# Patient Record
Sex: Female | Born: 1980 | Race: Black or African American | Hispanic: No | Marital: Single | Smoking: Current every day smoker
Health system: Southern US, Community
[De-identification: ages and names within clinical notes are randomized; demographics above are authoritative.]

## PROBLEM LIST (undated history)

## (undated) DIAGNOSIS — R87619 Unspecified abnormal cytological findings in specimens from cervix uteri: Secondary | ICD-10-CM

## (undated) DIAGNOSIS — B9689 Other specified bacterial agents as the cause of diseases classified elsewhere: Secondary | ICD-10-CM

## (undated) DIAGNOSIS — I10 Essential (primary) hypertension: Secondary | ICD-10-CM

## (undated) DIAGNOSIS — IMO0002 Reserved for concepts with insufficient information to code with codable children: Secondary | ICD-10-CM

## (undated) DIAGNOSIS — B999 Unspecified infectious disease: Secondary | ICD-10-CM

## (undated) DIAGNOSIS — H409 Unspecified glaucoma: Secondary | ICD-10-CM

## (undated) DIAGNOSIS — N760 Acute vaginitis: Secondary | ICD-10-CM

## (undated) DIAGNOSIS — A549 Gonococcal infection, unspecified: Secondary | ICD-10-CM

## (undated) HISTORY — DX: Unspecified glaucoma: H40.9

---
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
## 2000-10-07 ENCOUNTER — Other Ambulatory Visit: Admission: RE | Admit: 2000-10-07 | Discharge: 2000-10-07 | Payer: Self-pay | Admitting: Obstetrics and Gynecology

## 2001-03-15 ENCOUNTER — Inpatient Hospital Stay (HOSPITAL_COMMUNITY): Admission: AD | Admit: 2001-03-15 | Discharge: 2001-03-15 | Payer: Self-pay | Admitting: *Deleted

## 2001-03-24 ENCOUNTER — Inpatient Hospital Stay (HOSPITAL_COMMUNITY): Admission: AD | Admit: 2001-03-24 | Discharge: 2001-03-27 | Payer: Self-pay | Admitting: Obstetrics and Gynecology

## 2001-12-31 ENCOUNTER — Emergency Department (HOSPITAL_COMMUNITY): Admission: EM | Admit: 2001-12-31 | Discharge: 2001-12-31 | Payer: Self-pay | Admitting: Emergency Medicine

## 2001-12-31 ENCOUNTER — Encounter: Payer: Self-pay | Admitting: Emergency Medicine

## 2002-05-28 ENCOUNTER — Other Ambulatory Visit: Admission: RE | Admit: 2002-05-28 | Discharge: 2002-05-28 | Payer: Self-pay | Admitting: Obstetrics and Gynecology

## 2002-06-18 HISTORY — PX: TUBAL LIGATION: SHX77

## 2002-08-29 ENCOUNTER — Inpatient Hospital Stay (HOSPITAL_COMMUNITY): Admission: AD | Admit: 2002-08-29 | Discharge: 2002-09-01 | Payer: Self-pay | Admitting: Obstetrics and Gynecology

## 2002-10-22 ENCOUNTER — Ambulatory Visit (HOSPITAL_COMMUNITY): Admission: RE | Admit: 2002-10-22 | Discharge: 2002-10-22 | Payer: Self-pay | Admitting: Obstetrics and Gynecology

## 2003-09-01 ENCOUNTER — Emergency Department (HOSPITAL_COMMUNITY): Admission: EM | Admit: 2003-09-01 | Discharge: 2003-09-01 | Payer: Self-pay | Admitting: Emergency Medicine

## 2004-03-14 ENCOUNTER — Inpatient Hospital Stay (HOSPITAL_COMMUNITY): Admission: AD | Admit: 2004-03-14 | Discharge: 2004-03-14 | Payer: Self-pay | Admitting: *Deleted

## 2005-06-18 DIAGNOSIS — A549 Gonococcal infection, unspecified: Secondary | ICD-10-CM

## 2005-06-18 HISTORY — DX: Gonococcal infection, unspecified: A54.9

## 2006-04-30 ENCOUNTER — Other Ambulatory Visit: Admission: RE | Admit: 2006-04-30 | Discharge: 2006-04-30 | Payer: Self-pay | Admitting: Obstetrics and Gynecology

## 2006-08-03 ENCOUNTER — Emergency Department (HOSPITAL_COMMUNITY): Admission: EM | Admit: 2006-08-03 | Discharge: 2006-08-03 | Payer: Self-pay | Admitting: Family Medicine

## 2007-01-17 ENCOUNTER — Emergency Department (HOSPITAL_COMMUNITY): Admission: EM | Admit: 2007-01-17 | Discharge: 2007-01-17 | Payer: Self-pay | Admitting: Emergency Medicine

## 2007-06-19 HISTORY — PX: WISDOM TOOTH EXTRACTION: SHX21

## 2007-07-02 ENCOUNTER — Encounter: Admission: RE | Admit: 2007-07-02 | Discharge: 2007-07-02 | Payer: Self-pay | Admitting: Obstetrics and Gynecology

## 2007-12-25 ENCOUNTER — Emergency Department (HOSPITAL_COMMUNITY): Admission: EM | Admit: 2007-12-25 | Discharge: 2007-12-25 | Payer: Self-pay | Admitting: Emergency Medicine

## 2008-02-26 ENCOUNTER — Emergency Department (HOSPITAL_COMMUNITY): Admission: EM | Admit: 2008-02-26 | Discharge: 2008-02-26 | Payer: Self-pay | Admitting: Emergency Medicine

## 2010-07-09 ENCOUNTER — Encounter: Payer: Self-pay | Admitting: Obstetrics and Gynecology

## 2010-11-03 NOTE — Op Note (Signed)
NAMESIMON, Mikayla Hansen                    ACCOUNT NO.:  [id number]   MEDICAL RECORD NO.:  [id number]                   PATIENT TYPE:  AMB   LOCATION:  SDC                                  [facility]:  [facility]   PHYSICIAN:  Hal Morales, M.D.             DATE OF BIRTH:  12/25/1980   DATE OF PROCEDURE:  10/22/2002  DATE OF DISCHARGE:                                 OPERATIVE REPORT   PREOPERATIVE DIAGNOSIS:  Desire for surgical sterilization.   POSTOPERATIVE DIAGNOSIS:  Desire for surgical sterilization.   PROCEDURE:  Laparoscopic tubal cautery.   SURGEON:  Hal Morales, M.D.   ANESTHESIA:  General orotracheal.   ESTIMATED BLOOD LOSS:  Less than 10 mL.   COMPLICATIONS:  None.   FINDINGS:  The uterus, tubes, and ovaries were within normal limits.   DESCRIPTION OF PROCEDURE:  The patient was taken to the operating room after  appropriate identification and then placed on the operating table.  After  the attainment of adequate general anesthesia she was placed in the modified  lithotomy position.  The abdomen, perineum, and vagina were prepped with  multiple layers of Betadine.  A red Robinson catheter was used to empty the  bladder.  A Hulka tenaculum was placed on the cervix.  The abdomen was  draped as a sterile field.  Subumbilical and suprapubic injection of 0.25%  Marcaine for a total  of 10 mL was undertaken.  A subumbilical incision was  made and the Veress cannula placed through that incision into the peritoneal  cavity.  A pneumoperitoneum was created with 3.5 L of CO2.  The Veress  cannula was removed and a laparoscopic trocar placed through that incision  into the peritoneal cavity.  The laparoscope was placed through the trocar  sleeve.  The above-noted findings were made.  The right fallopian tube was  identified, followed to its fimbriated end, then grasped at the isthmic  portion and cauterized in two adjacent areas.  A similar procedure was  carried  out on the opposite side.  Hemostasis was noted to be adequate.  All  instruments were removed from the peritoneal cavity under direct  visualization as the CO2 was allowed to escape.  The subumbilical incision  was closed first with a fascial suture of 0 Vicryl, then with a subcuticular  suture of 3-0 Vicryl.  A sterile dressing was applied and the Hulka  tenaculum removed.  The patient was awakened from general anesthesia and  taken to the recovery room in satisfactory condition, having tolerated the  procedure well with sponge and instrument counts correct.                                               Hal Morales, M.D.    VPH/MEDQ  D:  10/22/2002  T:  10/23/2002  Job:  [id number]
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
## 2010-11-03 NOTE — H&P (Signed)
NAME:  Mikayla Hansen, Mikayla Hansen                    ACCOUNT NO.:  [id number]   MEDICAL RECORD NO.:  [id number]                   PATIENT TYPE:  AMB   LOCATION:  SDC                                  [facility]   PHYSICIAN:  Hal Morales, M.D.             DATE OF BIRTH:  [birth year]   DATE OF ADMISSION:  DATE OF DISCHARGE:                                HISTORY & PHYSICAL   HISTORY OF PRESENT ILLNESS:  The patient is a 30 year old, single, African  American female, para 4-0-0-4, who presents for laparoscopic tubal cautery  due to her desire for permanent sterilization.  The patient's last menstrual  period was on October 04, 2002, and was within normal limits.  She had a  negative pregnancy test on Oct 21, 2002.   OBSTETRICAL HISTORY:  Gravida 4, para 4-0-0-4.  She has a history of preterm  labor during two of her pregnancies.   GYNECOLOGIC HISTORY:  Menarche at 30 years old.  The patient's menstrual  periods are regular.  She does have a history of HPV, Trichomonas, and  chlamydia.  She also has a history of abnormal Pap smears.  Her last normal  Pap smear was in December of 2003.   PAST MEDICAL HISTORY:  Positive for asthma.  The patient denies any history  of blood transfusions.   PAST SURGICAL HISTORY:  Negative.   FAMILY HISTORY:  Positive for diabetes mellitus and hypertension.   SOCIAL HISTORY:  The patient is single and she attends [employer] in an effort to  acquire her gradual equivalency diploma.   CURRENT HABITS:  The patient smokes one packs of cigarettes per day.  She  drinks alcohol on occasion.   MEDICATIONS:  Prenatal vitamins.   ALLERGIES:  She has no known drug allergies.   REVIEW OF SYSTEMS:  Positive for wearing glasses, otherwise negative.   PHYSICAL EXAMINATION:  VITAL SIGNS:  Blood pressure 100/60.  WEIGHT:  141 pounds.  HEIGHT:  5 feet 4-1/2 inches tall.  NECK:  Supple without masses.  There is no adenopathy or thyromegaly.  HEART:  Regular rate and  rhythm.  There is no murmur.  LUNGS:  Clear without wheezing, rhonchi, or rales.  BACK:  No CVA tenderness.  ABDOMEN:  Bowel sounds are present.  It is soft without tenderness,  guarding, rebound, or organomegaly.  EXTREMITIES:  Without cyanosis, clubbing, or edema.  PELVIC:  EG and BUS are within normal limits.  The vagina is normal.  The  cervix is normal without lesions.  The uterus is normal size, shape, and  consistency without tenderness.  Adnexa without tenderness or masses.  Rectovaginal without tenderness or masses.   LABORATORY DATA:  The urine pregnancy test is negative.   IMPRESSION:  Desire for permanent sterilization.   DISPOSITION:  The patient signed the consent for sterilization on August 07, 2002, which states that he understands that sterilization must be  considered permanent and not  reversible and that she has decided that she  does not want to become pregnant.  She further verbalizes understanding of  the procedure along with its risks, which include reaction to anesthesia,  damage to adjacent organs, bleeding, and infection.  The patient is  scheduled to undergo laparoscopy tubal cautery at [facility] on Oct 22, 2002, at 12:30 p.m.     [place]mira J. Adline Peals.                    Hal Morales, M.D.    EJP/MEDQ  D:  10/21/2002  T:  10/21/2002  Job:  [id number]

## 2011-03-26 ENCOUNTER — Encounter (INDEPENDENT_AMBULATORY_CARE_PROVIDER_SITE_OTHER): Payer: Self-pay | Admitting: Ophthalmology

## 2011-04-09 ENCOUNTER — Encounter (INDEPENDENT_AMBULATORY_CARE_PROVIDER_SITE_OTHER): Payer: Self-pay | Admitting: Ophthalmology

## 2011-04-16 ENCOUNTER — Encounter (INDEPENDENT_AMBULATORY_CARE_PROVIDER_SITE_OTHER): Payer: Medicaid Other | Admitting: Ophthalmology

## 2011-04-16 DIAGNOSIS — H40019 Open angle with borderline findings, low risk, unspecified eye: Secondary | ICD-10-CM

## 2011-04-16 DIAGNOSIS — H3540 Unspecified peripheral retinal degeneration: Secondary | ICD-10-CM

## 2011-04-16 DIAGNOSIS — H43819 Vitreous degeneration, unspecified eye: Secondary | ICD-10-CM

## 2011-08-30 ENCOUNTER — Ambulatory Visit: Payer: Medicaid Other | Admitting: Family Medicine

## 2011-09-07 ENCOUNTER — Other Ambulatory Visit (HOSPITAL_COMMUNITY)
Admission: RE | Admit: 2011-09-07 | Discharge: 2011-09-07 | Disposition: A | Discharge status: Home / Self Care | Payer: Medicaid Other | Source: Ambulatory Visit | Attending: Family Medicine | Admitting: Family Medicine

## 2011-09-07 ENCOUNTER — Encounter: Payer: Self-pay | Admitting: Family Medicine

## 2011-09-07 ENCOUNTER — Ambulatory Visit (INDEPENDENT_AMBULATORY_CARE_PROVIDER_SITE_OTHER): Payer: Medicaid Other | Admitting: Family Medicine

## 2011-09-07 ENCOUNTER — Other Ambulatory Visit (HOSPITAL_COMMUNITY)
Admission: RE | Admit: 2011-09-07 | Discharge: 2011-09-07 | Disposition: A | Discharge status: Home / Self Care | Payer: Self-pay | Source: Ambulatory Visit | Attending: Emergency Medicine | Admitting: Emergency Medicine

## 2011-09-07 VITALS — BP 130/92 | HR 85 | Temp 98.4°F | Ht 64.0 in | Wt 184.2 lb

## 2011-09-07 DIAGNOSIS — B9689 Other specified bacterial agents as the cause of diseases classified elsewhere: Secondary | ICD-10-CM

## 2011-09-07 DIAGNOSIS — N760 Acute vaginitis: Secondary | ICD-10-CM | POA: Insufficient documentation

## 2011-09-07 DIAGNOSIS — F17200 Nicotine dependence, unspecified, uncomplicated: Secondary | ICD-10-CM

## 2011-09-07 DIAGNOSIS — Z124 Encounter for screening for malignant neoplasm of cervix: Secondary | ICD-10-CM

## 2011-09-07 DIAGNOSIS — Z113 Encounter for screening for infections with a predominantly sexual mode of transmission: Secondary | ICD-10-CM | POA: Insufficient documentation

## 2011-09-07 DIAGNOSIS — Z8709 Personal history of other diseases of the respiratory system: Secondary | ICD-10-CM | POA: Insufficient documentation

## 2011-09-07 DIAGNOSIS — J45909 Unspecified asthma, uncomplicated: Secondary | ICD-10-CM

## 2011-09-07 DIAGNOSIS — Z01419 Encounter for gynecological examination (general) (routine) without abnormal findings: Secondary | ICD-10-CM | POA: Insufficient documentation

## 2011-09-07 DIAGNOSIS — E669 Obesity, unspecified: Secondary | ICD-10-CM | POA: Insufficient documentation

## 2011-09-07 DIAGNOSIS — Z23 Encounter for immunization: Secondary | ICD-10-CM

## 2011-09-07 DIAGNOSIS — A499 Bacterial infection, unspecified: Secondary | ICD-10-CM

## 2011-09-07 HISTORY — DX: Other specified bacterial agents as the cause of diseases classified elsewhere: B96.89

## 2011-09-07 LAB — POCT WET PREP (WET MOUNT)

## 2011-09-07 MED ORDER — METRONIDAZOLE 500 MG PO TABS: 500.0000 mg | ORAL_TABLET | Freq: Two times a day (BID) | ORAL | Status: AC

## 2011-09-07 NOTE — Patient Instructions (Signed)
It has been a pleasure to meet  you today. Please take the medications as prescribed. I will call you with the labs results if they come back abnormal otherwise you will receive a letter. Make your next appointment in 6 months or sooner if you need it.

## 2011-09-08 LAB — CBC
MCH: 23.5 pg — ABNORMAL LOW (ref 26.0–34.0)
Platelets: 294 10*3/uL (ref 150–400)
RBC: 5.84 MIL/uL — ABNORMAL HIGH (ref 3.87–5.11)
RDW: 15 % (ref 11.5–15.5)
WBC: 8.9 10*3/uL (ref 4.0–10.5)

## 2011-09-08 LAB — COMPREHENSIVE METABOLIC PANEL
Albumin: 4.4 g/dL (ref 3.5–5.2)
Alkaline Phosphatase: 65 U/L (ref 39–117)
CO2: 20 mEq/L (ref 19–32)
Glucose, Bld: 95 mg/dL (ref 70–99)
Potassium: 3.9 mEq/L (ref 3.5–5.3)
Sodium: 140 mEq/L (ref 135–145)
Total Protein: 7.1 g/dL (ref 6.0–8.3)

## 2011-09-09 NOTE — Progress Notes (Signed)
  Subjective:    Patient ID: Mikayla Hansen, female    DOB: 1980-12-19, 31 y.o.   MRN: [id number]  HPI Patient comes today for her first visit. She has history of asthma and was taking only albuterol when necessary, and for the past 3 years has not had any episodes of shortness of breath. She also has a history of being a heavy smoker, smoking up to 2-3 packs a day and have reduced to 1 pack a day for about one year. She also has history of an abnormal Pap smear 3 years ago,  that was taken at [facility] with no follow up as this patient never went back to that clinic. She complains at this time of vaginal discharge and intentional weight loss about 10 lb with diet management. No changes in appetite. No burning with urination, no changes in her bowel movement habits. Denies any other symptoms of headache, chest pain, palpitations.   Review of Systems  Constitutional: Negative for chills, activity change, appetite change, fatigue and unexpected weight change.  HENT: Negative.   Eyes: Negative.   Respiratory: Negative for cough and chest tightness.        Objective:   Physical Exam  Constitutional: She is oriented to person, place, and time. No distress.  HENT:  Head: Normocephalic and atraumatic.  Right Ear: External ear normal.  Left Ear: External ear normal.  Mouth/Throat: Oropharynx is clear and moist.  Eyes: Conjunctivae and EOM are normal. Pupils are equal, round, and reactive to light.  Neck: Normal range of motion. Neck supple. No thyromegaly present.  Cardiovascular: Normal rate, regular rhythm and intact distal pulses.  Exam reveals no gallop and no friction rub.   No murmur heard. Pulmonary/Chest: Effort normal and breath sounds normal. No respiratory distress. She has no wheezes. She has no rales.  Abdominal: Soft. Bowel sounds are normal. She exhibits no distension and no mass. There is no tenderness.  Genitourinary: Uterus normal. Vaginal discharge found.         GU: No CVA tenderness.  Speculum: Thin grayish fetid vaginal discharge. Cervix friable to pap smear procedure.  Musculoskeletal: Normal range of motion. She exhibits no edema and no tenderness.  Lymphadenopathy:    She has no cervical adenopathy.  Neurological: She is alert and oriented to person, place, and time. She has normal reflexes.  Skin: Skin is warm and dry.          Assessment & Plan:

## 2011-09-11 ENCOUNTER — Encounter: Payer: Self-pay | Admitting: Family Medicine

## 2011-09-11 DIAGNOSIS — F17200 Nicotine dependence, unspecified, uncomplicated: Secondary | ICD-10-CM | POA: Insufficient documentation

## 2011-09-11 NOTE — Assessment & Plan Note (Signed)
Not ready to completely quit at this point.  Plan:  1800-quitnow offered. Counseling about smoking cessation given.

## 2011-09-11 NOTE — Assessment & Plan Note (Signed)
Pt used to take albuterol PRN. Has not had the need to take it in 3 years. She has dogs at home that she breads and has no change environment recently. No SOB or episodic coughing/wheezes.  Plan: Education given. If pt experienced any SOB should get evaluated. Will f/u at next visit.

## 2011-09-11 NOTE — Assessment & Plan Note (Addendum)
Pt mention Hx of abnormal pap smear and vaginal discharge. Positive wet prep Plan:  -Metronidazole 500 mg BID for 7 days. Side effects and alcohol avoidance explained and pt agreeable. -pap smear and will f/u results.

## 2011-09-11 NOTE — Assessment & Plan Note (Addendum)
Pt mention she has lost about 10 Lb intentionally (with diet modification) we dont have a previous weight to compare.  Plan: Positive reinforcement about weight control. Brief nutritional education given.Continue evaluating weight curve. -CBC, Cmet, Lipid Profile.

## 2011-10-09 ENCOUNTER — Telehealth: Payer: Self-pay | Admitting: Family Medicine

## 2011-10-09 ENCOUNTER — Encounter: Payer: Self-pay | Admitting: Family Medicine

## 2011-10-09 ENCOUNTER — Ambulatory Visit (INDEPENDENT_AMBULATORY_CARE_PROVIDER_SITE_OTHER): Payer: Medicaid Other | Admitting: Family Medicine

## 2011-10-09 VITALS — BP 130/84 | HR 88 | Temp 98.1°F | Wt 184.7 lb

## 2011-10-09 DIAGNOSIS — R300 Dysuria: Secondary | ICD-10-CM

## 2011-10-09 DIAGNOSIS — B373 Candidiasis of vulva and vagina: Secondary | ICD-10-CM

## 2011-10-09 DIAGNOSIS — N760 Acute vaginitis: Secondary | ICD-10-CM

## 2011-10-09 LAB — POCT UA - MICROSCOPIC ONLY: Epithelial cells, urine per micros: >20

## 2011-10-09 LAB — POCT URINALYSIS DIPSTICK
Bilirubin, UA: NEGATIVE
Glucose, UA: NEGATIVE
Ketones, UA: NEGATIVE
pH, UA: 6

## 2011-10-09 LAB — POCT WET PREP (WET MOUNT)

## 2011-10-09 MED ORDER — FLUCONAZOLE 150 MG PO TABS: 150.0000 mg | ORAL_TABLET | Freq: Once | ORAL | Status: AC

## 2011-10-09 NOTE — Assessment & Plan Note (Addendum)
Treat with fluconazole 150mg  x1. Follow up in 5 days if not improved.  Due to dysuria, will obtain UA but suspect likely just from irritation from wiping. UA also shows hyphae and spores consistent with yeast infection. Do not suspect candidal UTI as there are many squamous cells and likely a poor sample.

## 2011-10-09 NOTE — Telephone Encounter (Signed)
Patient is calling because she is having a reaction to Flagyl.  She says it is urgent that she speaks to Dr. Aviva Signs, I offered for her to speak to the nurse, but she said she preferred to speak to the doctor.

## 2011-10-09 NOTE — Telephone Encounter (Signed)
Spoke with patient. She did not start Flagyl prescribed at last appointment until around 04/12. Finished the medication end of last week. Now she is having severe  vaginal itching and burning .  Does not think she can wait until tomorrow for appointment. Advised to come to office now.

## 2011-10-09 NOTE — Progress Notes (Signed)
  Subjective:    Patient ID: Mikayla Hansen, female    DOB: [birth year], 31 y.o.   MRN: [id number]  HPI patient was treated for bacterial vaginosis after visit on March 22. She did not start the therapy until April 12. 2-3 days ago after finishing her therapy she developed vaginal itching and burning. She describes itching and burning as severe. This is associated with vaginal discharge which has a foul odor. She says the discharge has a cottage cheese consistency. She is also noted pain with urination starting today and she says she's had a wipe a great deal due to the discharge.   Denies fevers, nausea, vomiting, chills, feeling poorly except for itching, new sexual contacts.  Review of Systems -See HPI  Past Medical History-smoking status noted: current smoker. Obesity, recent bacterial vaginosis.  Reviewed problem list.  Medications- reviewed and updated Chief complaint-noted    Objective:   Physical Exam  Constitutional: She is oriented to person, place, and time. She appears well-developed and well-nourished. No distress.  HENT:  Head: Normocephalic and atraumatic.  Right Ear: External ear normal.  Left Ear: External ear normal.  Eyes: EOM are normal. Right eye exhibits no discharge. Left eye exhibits no discharge.  Neck: Normal range of motion. Neck supple.  Genitourinary: Vaginal discharge (blind swab performed) found.  Musculoskeletal: Normal range of motion. She exhibits no edema.  Neurological: She is alert and oriented to person, place, and time.  Skin: She is not diaphoretic.      Assessment & Plan:

## 2011-10-09 NOTE — Patient Instructions (Signed)
Mikayla Hansen I believe you have a yeast infection after starting antibiotics. I have sent in a prescription that you only need to take 1 pill. We are also going to test your urine since you have had some burning but I expect this to be from all the wiping you've been doing. If things aren't better in the next 5 days, you should be reevaluated. We have sent a lab to confirm that it is a yeast infection but since we are treating you if you don't hear from me, then assume it is a yeast infection.   Tana Conch, MD, PGY1 10/09/2011 5:06 PM

## 2011-10-16 ENCOUNTER — Inpatient Hospital Stay (HOSPITAL_COMMUNITY)
Admission: AD | Admit: 2011-10-16 | Discharge: 2011-10-16 | Disposition: A | Discharge status: Home / Self Care | Payer: Medicaid Other | Source: Ambulatory Visit | Attending: Obstetrics & Gynecology | Admitting: Obstetrics & Gynecology

## 2011-10-16 ENCOUNTER — Encounter (HOSPITAL_COMMUNITY): Payer: Self-pay | Admitting: *Deleted

## 2011-10-16 DIAGNOSIS — L293 Anogenital pruritus, unspecified: Secondary | ICD-10-CM | POA: Insufficient documentation

## 2011-10-16 DIAGNOSIS — N907 Vulvar cyst: Secondary | ICD-10-CM

## 2011-10-16 DIAGNOSIS — N9089 Other specified noninflammatory disorders of vulva and perineum: Secondary | ICD-10-CM | POA: Insufficient documentation

## 2011-10-16 HISTORY — DX: Gonococcal infection, unspecified: A54.9

## 2011-10-16 HISTORY — DX: Acute vaginitis: N76.0

## 2011-10-16 HISTORY — DX: Other specified bacterial agents as the cause of diseases classified elsewhere: B96.89

## 2011-10-16 HISTORY — DX: Unspecified abnormal cytological findings in specimens from cervix uteri: R87.619

## 2011-10-16 HISTORY — DX: Reserved for concepts with insufficient information to code with codable children: IMO0002

## 2011-10-16 HISTORY — DX: Unspecified infectious disease: B99.9

## 2011-10-16 MED ORDER — OXYCODONE-ACETAMINOPHEN 5-325 MG PO TABS: 1.0000 | ORAL_TABLET | ORAL | Status: AC | PRN

## 2011-10-16 MED ORDER — KETOROLAC TROMETHAMINE 60 MG/2ML IM SOLN
60.0000 mg | Freq: Once | INTRAMUSCULAR | Status: AC
  Administered 2011-10-16: 60 mg via INTRAMUSCULAR
  Filled 2011-10-16: qty 2

## 2011-10-16 MED ORDER — KETOROLAC TROMETHAMINE 10 MG PO TABS: 10.0000 mg | ORAL_TABLET | Freq: Four times a day (QID) | ORAL | Status: AC | PRN

## 2011-10-16 NOTE — MAU Note (Signed)
Pt was Dx with BV at [facility] and has completed Flagyl and was seen again this past Tues. For Yeast infection after antibiotic use.  Pt was given diflucan for treatment and continues to have itching in vaginal area and has now noticed a bump on the right labia majoria since Thurs. Of last week.  It has grown in size since Thurs from a size of pimple to now a lima bean.  Pt is currently on her period at this time.

## 2011-10-16 NOTE — MAU Provider Note (Signed)
Mikayla Hansen [id number]. Chief Complaint  Patient presents with  . Vaginal Itching  . Mass    on right labia     First Provider Initiated Contact with Patient 10/16/11 0947      SUBJECTIVE  HPI: Painful lump on right labia minora x 4 days, enlarging. No fever, chills, vaginal discharge. No Hx Bartholin's cysts or Herpes.  Past Medical History  Diagnosis Date  . Asthma   . Abnormal Pap smear   . Infection   . Gonorrhea 2007  . Bacterial vaginosis 09-07-2011   Past Surgical History  Procedure Date  . Tubal ligation 2004  . Wisdom tooth extraction 2009   History   Social History  . Marital Status: Single    Spouse Name: N/A    Number of Children: N/A  . Years of Education: N/A   Occupational History  . Not on file.   Social History Main Topics  . Smoking status: Current Everyday Smoker -- 1.5 packs/day  . Smokeless tobacco: Never Used  . Alcohol Use: No  . Drug Use: No  . Sexually Active: Yes    Birth Control/ Protection: Surgical   Other Topics Concern  . Not on file   Social History Narrative   Lives with 4 children and husband (married for 17 years). 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   No current facility-administered medications on file prior to encounter.   No current outpatient prescriptions on file prior to encounter.   Allergies  Allergen Reactions  . Hydrocodone Palpitations    ROS: Pertinent items in HPI  OBJECTIVE Blood pressure 134/82, pulse 89, temperature 98.6 F (37 C), temperature source Oral, resp. rate 16, height 5\' 4"  (1.626 m), weight 84.097 kg (185 lb 6.4 oz), last menstrual period 09/19/2011, SpO2 100.00%.  GENERAL: Well-developed, well-nourished female in mild distress.  HEENT: Normocephalic, good dentition HEART: normal rate RESP: normal effort ABDOMEN: Soft, nontender EXTREMITIES: Nontender, no edema NEURO: Alert and oriented PELVIC: 1x2 cm fluctuant, tender mass on mid-labia minora. No erythema or  drainage.   Discussed options of expectant management w/ warm soaks/compresses vs I&D. Pt strongly desires I&D Incision and Drainage Procedure Note  Pre-operative Diagnosis: Labial inclusion cyst  Post-operative Diagnosis: same  Indications: Pain  Analgesia/Anesthesia: Toradol, 1% plain lidocaine  Procedure Details  The procedure, risks and complications have been discussed in detail (including, but not limited to airway compromise, infection, bleeding) with the patient, and the patient has signed consent to the procedure. A timeout was performed.   The skin was cleaned x 3 w/ betadine and sterilely prepped and draped over the affected area in the usual fashion. After adequate local anesthesia, I&D with a #11 blade was performed on the right labia minora. Drained moderate amount of non-purulent drainage.. The patient tolerate the procedure well. The patient was observed until stable.  Findings: See above  EBL: 1 cc's  Drains: none  Condition: Tolerated procedure well and Stable   Complications: none.  ASSESSMENT 1. Sebaceous cyst of labia    PLAN D/C home Follow-up Information    Follow up with [facility]. (As needed if symptoms worsen)    Contact information:   [address] [place] [address] [phone]        Medication List  As of 10/22/2011  4:35 PM   START taking these medications         oxyCODONE-acetaminophen 5-325 MG per tablet   Commonly known as: PERCOCET   Take 1-2 tablets  by mouth every 4 (four) hours as needed for pain.         STOP taking these medications         metroNIDAZOLE 500 MG tablet      MIDOL COMPLETE PO      naproxen sodium 220 MG tablet          Where to get your medications    These are the prescriptions that you need to pick up.   You may get these medications from any pharmacy.         oxyCODONE-acetaminophen 5-325 MG per tablet          Bleeding and infections precautions No IC x 1  week  Dorathy Kinsman 10/16/2011 9:48 AM

## 2012-02-17 ENCOUNTER — Inpatient Hospital Stay (HOSPITAL_COMMUNITY)
Admission: AD | Admit: 2012-02-17 | Discharge: 2012-02-17 | Disposition: A | Discharge status: Hospice | Payer: Medicaid Other | Source: Ambulatory Visit | Attending: Family Medicine | Admitting: Family Medicine

## 2012-02-17 ENCOUNTER — Encounter (HOSPITAL_COMMUNITY): Payer: Self-pay | Admitting: *Deleted

## 2012-02-17 DIAGNOSIS — H60399 Other infective otitis externa, unspecified ear: Secondary | ICD-10-CM | POA: Insufficient documentation

## 2012-02-17 DIAGNOSIS — H6090 Unspecified otitis externa, unspecified ear: Secondary | ICD-10-CM

## 2012-02-17 DIAGNOSIS — I10 Essential (primary) hypertension: Secondary | ICD-10-CM

## 2012-02-17 DIAGNOSIS — L738 Other specified follicular disorders: Secondary | ICD-10-CM | POA: Insufficient documentation

## 2012-02-17 DIAGNOSIS — R030 Elevated blood-pressure reading, without diagnosis of hypertension: Secondary | ICD-10-CM | POA: Insufficient documentation

## 2012-02-17 DIAGNOSIS — L739 Follicular disorder, unspecified: Secondary | ICD-10-CM

## 2012-02-17 DIAGNOSIS — N949 Unspecified condition associated with female genital organs and menstrual cycle: Secondary | ICD-10-CM | POA: Insufficient documentation

## 2012-02-17 MED ORDER — SULFAMETHOXAZOLE-TRIMETHOPRIM 800-160 MG PO TABS: 1.0000 | ORAL_TABLET | Freq: Two times a day (BID) | ORAL | Status: AC

## 2012-02-17 MED ORDER — CIPROFLOXACIN-DEXAMETHASONE 0.3-0.1 % OT SUSP: 4.0000 [drp] | Freq: Two times a day (BID) | OTIC | Status: AC

## 2012-02-17 NOTE — MAU Note (Addendum)
Pt corrected her complaint saying her vaginal bump is in the right groin not the labia and is the size of an eraser head.

## 2012-02-17 NOTE — MAU Note (Signed)
Pt reports having vaginal "bumps" on left side of her labia.pt stated she  "busted it 2 days ago. Still having pain in area. Was treated 1 month ago for barthalins cyst on the right. Pt also c/o  Ear ache on right ear that started this morning.

## 2012-02-17 NOTE — MAU Provider Note (Signed)
History     CSN: [id number]  Arrival date and time: 02/17/12 1434   First Provider Initiated Contact with Patient 02/17/12 1544      Chief Complaint  Patient presents with  . Groin Swelling   HPI Pt has lump in R groin for past few days. Had one in same place last week that she popped, which is now healing. Also has hx bartholin's cyst. Lump in tender/painful esp when walking, as legs rub together. No fever/chills.  Also has R ear pain for 2 days, drained orangish fluid today. No sore throat or other URI symptoms. Has hx swimmer's ear but not swimming recently.  Pt also has headache. Hx migraines. R shoulder pain radiating down arm.   Past Medical History  Diagnosis Date  . Asthma   . Abnormal Pap smear   . Infection   . Gonorrhea 2007  . Bacterial vaginosis 09-07-2011    Past Surgical History  Procedure Date  . Tubal ligation 2004  . Wisdom tooth extraction 2009    Family History  Problem Relation Age of Onset  . Hypertension Mother   . Seizures Mother   . Anesthesia problems Neg Hx     History  Substance Use Topics  . Smoking status: Current Everyday Smoker -- 1.5 packs/day    Types: Cigarettes  . Smokeless tobacco: Never Used  . Alcohol Use: No    Allergies:  Allergies  Allergen Reactions  . Hydrocodone Palpitations    No prescriptions prior to admission    Review of Systems  Constitutional: Negative for fever and chills.  HENT: Positive for ear pain and ear discharge. Negative for hearing loss, congestion and sore throat.   Eyes: Negative for blurred vision and double vision.  Respiratory: Negative for cough, shortness of breath and wheezing.   Cardiovascular: Negative for chest pain and palpitations.  Gastrointestinal: Negative for nausea, vomiting and abdominal pain.  Genitourinary: Negative for dysuria.  Skin: Negative for rash.  Neurological: Positive for headaches. Negative for dizziness.   Physical Exam   Blood pressure 153/98, pulse  75, temperature 99.8 F (37.7 C), temperature source Oral, resp. rate 18, height 5\' 4"  (1.626 m), weight 84.505 kg (186 lb 4.8 oz), last menstrual period 02/09/2012.   Physical Exam  Constitutional: She is oriented to person, place, and time. She appears well-developed and well-nourished. No distress.  HENT:  Head: Normocephalic and atraumatic.       R ear, redness in canal. TM gray/normal, some cerumen. Tender to push on tragus and post-auricular area.  Eyes: Conjunctivae and EOM are normal.  Neck: Normal range of motion. Neck supple.  Cardiovascular: Normal rate, regular rhythm and normal heart sounds.   Respiratory: Effort normal and breath sounds normal. No respiratory distress.  GI: Soft. There is no tenderness. There is no rebound and no guarding.  Musculoskeletal: Normal range of motion. She exhibits no edema and no tenderness.  Neurological: She is alert and oriented to person, place, and time.  Skin: Skin is warm and dry.       1 cm firm, non-fluctuant mass in R groin around hair follicle. No drainage. Tender. Just above is scar that is healing.  Psychiatric: She has a normal mood and affect.    MAU Course  Procedures   Assessment and Plan  31 y.o. with  1.  Folliculitis.  Bactrim DS BID for 10 days. Recommend cease shaving. Let area heal completely. Hot compresses. 2.  Otitis externa - Ciprodex drops x 7 days. 3.  High BP.  F/u with PCP for recheck in next 1 to 2 weeks.  Napoleon Form 02/17/2012, 3:56 PM

## 2012-04-15 ENCOUNTER — Encounter (INDEPENDENT_AMBULATORY_CARE_PROVIDER_SITE_OTHER): Payer: Medicaid Other | Admitting: Ophthalmology

## 2013-05-15 ENCOUNTER — Inpatient Hospital Stay (HOSPITAL_COMMUNITY)
Admission: AD | Admit: 2013-05-15 | Discharge: 2013-05-15 | Discharge status: Against Medical Advice | Payer: Medicaid Other | Source: Ambulatory Visit | Attending: Obstetrics and Gynecology | Admitting: Obstetrics and Gynecology

## 2013-05-15 NOTE — MAU Note (Signed)
Per registration clerk, pt stated she would wait to see her MD and walked out. Unable to catch patient to sign AMA form.

## 2013-10-22 ENCOUNTER — Other Ambulatory Visit: Payer: Self-pay

## 2013-10-22 ENCOUNTER — Other Ambulatory Visit: Payer: Self-pay | Admitting: Emergency Medicine

## 2013-10-22 DIAGNOSIS — Z1231 Encounter for screening mammogram for malignant neoplasm of breast: Secondary | ICD-10-CM

## 2013-10-29 ENCOUNTER — Ambulatory Visit: Payer: Medicaid Other

## 2013-11-04 DIAGNOSIS — J45909 Unspecified asthma, uncomplicated: Secondary | ICD-10-CM | POA: Insufficient documentation

## 2014-02-18 DIAGNOSIS — L290 Pruritus ani: Secondary | ICD-10-CM | POA: Insufficient documentation

## 2014-04-19 ENCOUNTER — Encounter (HOSPITAL_COMMUNITY): Payer: Self-pay | Admitting: *Deleted

## 2014-04-27 ENCOUNTER — Emergency Department (HOSPITAL_COMMUNITY)
Admission: EM | Admit: 2014-04-27 | Discharge: 2014-04-27 | Discharge status: Against Medical Advice | Payer: Medicaid Other | Attending: Emergency Medicine | Admitting: Emergency Medicine

## 2014-04-27 ENCOUNTER — Emergency Department (HOSPITAL_COMMUNITY): Payer: Medicaid Other

## 2014-04-27 ENCOUNTER — Other Ambulatory Visit: Payer: Self-pay

## 2014-04-27 ENCOUNTER — Encounter (HOSPITAL_COMMUNITY): Payer: Self-pay | Admitting: Emergency Medicine

## 2014-04-27 DIAGNOSIS — Z720 Tobacco use: Secondary | ICD-10-CM | POA: Insufficient documentation

## 2014-04-27 DIAGNOSIS — R079 Chest pain, unspecified: Secondary | ICD-10-CM | POA: Diagnosis present

## 2014-04-27 DIAGNOSIS — J45909 Unspecified asthma, uncomplicated: Secondary | ICD-10-CM | POA: Insufficient documentation

## 2014-04-27 DIAGNOSIS — Z3202 Encounter for pregnancy test, result negative: Secondary | ICD-10-CM | POA: Diagnosis not present

## 2014-04-27 LAB — CBC WITH DIFFERENTIAL/PLATELET
BASOS ABS: 0 10*3/uL (ref 0.0–0.1)
Basophils Relative: 0 % (ref 0–1)
EOS PCT: 2 % (ref 0–5)
Eosinophils Absolute: 0.2 10*3/uL (ref 0.0–0.7)
HCT: 40.9 % (ref 36.0–46.0)
Hemoglobin: 13.6 g/dL (ref 12.0–15.0)
LYMPHS ABS: 2.6 10*3/uL (ref 0.7–4.0)
LYMPHS PCT: 29 % (ref 12–46)
MCH: 23.8 pg — ABNORMAL LOW (ref 26.0–34.0)
MCHC: 33.3 g/dL (ref 30.0–36.0)
MCV: 71.5 fL — ABNORMAL LOW (ref 78.0–100.0)
MONOS PCT: 7 % (ref 3–12)
Monocytes Absolute: 0.6 10*3/uL (ref 0.1–1.0)
NEUTROS PCT: 62 % (ref 43–77)
Neutro Abs: 5.5 10*3/uL (ref 1.7–7.7)
PLATELETS: 279 10*3/uL (ref 150–400)
RBC: 5.72 MIL/uL — AB (ref 3.87–5.11)
RDW: 14.9 % (ref 11.5–15.5)
WBC: 8.9 10*3/uL (ref 4.0–10.5)

## 2014-04-27 LAB — BASIC METABOLIC PANEL
ANION GAP: 14 (ref 5–15)
BUN: 10 mg/dL (ref 6–23)
CO2: 23 mEq/L (ref 19–32)
CREATININE: 0.8 mg/dL (ref 0.50–1.10)
Calcium: 9.7 mg/dL (ref 8.4–10.5)
Chloride: 103 mEq/L (ref 96–112)
GFR calc Af Amer: >90 mL/min (ref >90)
GFR calc non Af Amer: >90 mL/min (ref >90)
Glucose, Bld: 102 mg/dL — ABNORMAL HIGH (ref 70–99)
Potassium: 4.1 mEq/L (ref 3.7–5.3)
SODIUM: 140 meq/L (ref 137–147)

## 2014-04-27 LAB — I-STAT TROPONIN, ED: TROPONIN I, POC: 0.01 ng/mL (ref 0.00–0.08)

## 2014-04-27 LAB — POC URINE PREG, ED: PREG TEST UR: NEGATIVE

## 2014-04-27 LAB — PRO B NATRIURETIC PEPTIDE: Pro B Natriuretic peptide (BNP): 31.7 pg/mL (ref 0–125)

## 2014-04-27 MED ORDER — ASPIRIN 325 MG PO TABS
325.0000 mg | ORAL_TABLET | ORAL | Status: AC
  Administered 2014-04-27: 325 mg via ORAL
  Filled 2014-04-27: qty 1

## 2014-04-27 NOTE — ED Notes (Signed)
The patient said she has been having chest pain since 1700 this afternoon.  Her chest pain got worse and so she decided to come in.  She describes her pain as a stabbing pain.  She denies any symptoms other that SOB, lightheadedness, tingling in her left arm, and pain in her neck when she takes a deep breath.

## 2014-04-29 DIAGNOSIS — I10 Essential (primary) hypertension: Secondary | ICD-10-CM | POA: Insufficient documentation

## 2014-04-29 DIAGNOSIS — R079 Chest pain, unspecified: Secondary | ICD-10-CM | POA: Insufficient documentation

## 2014-04-29 DIAGNOSIS — F419 Anxiety disorder, unspecified: Secondary | ICD-10-CM | POA: Insufficient documentation

## 2014-06-12 ENCOUNTER — Emergency Department (HOSPITAL_COMMUNITY)
Admission: EM | Admit: 2014-06-12 | Discharge: 2014-06-12 | Disposition: A | Discharge status: Home / Self Care | Payer: Medicaid Other | Source: Home / Self Care | Attending: Family Medicine | Admitting: Family Medicine

## 2014-06-12 ENCOUNTER — Encounter (HOSPITAL_COMMUNITY): Payer: Self-pay | Admitting: *Deleted

## 2014-06-12 DIAGNOSIS — L0100 Impetigo, unspecified: Secondary | ICD-10-CM

## 2014-06-12 MED ORDER — MUPIROCIN 2 % EX OINT
1.0000 | TOPICAL_OINTMENT | Freq: Two times a day (BID) | CUTANEOUS | Status: DC
Start: 2014-06-12 — End: 2015-01-05

## 2014-06-12 NOTE — ED Provider Notes (Signed)
CSN: [id number]     Arrival date & time 06/12/14  1009 History   First MD Initiated Contact with Patient 06/12/14 1100     Chief Complaint  Patient presents with  . Nose Problem   (Consider location/radiation/quality/duration/timing/severity/associated sxs/prior Treatment) HPI Comments: Reports recent URI with rhinorrhea. Now has had several days of pain and swelling with lesion inside her left nostril. Has tried treating at home with petroleum jelly with no improvement Reports herself to be otherwise healthy.    The history is provided by the patient.    Past Medical History  Diagnosis Date  . Asthma   . Abnormal Pap smear   . Infection   . Gonorrhea 2007  . Bacterial vaginosis 09-07-2011   Past Surgical History  Procedure Laterality Date  . Tubal ligation  2004  . Wisdom tooth extraction  2009   Family History  Problem Relation Age of Onset  . Hypertension Mother   . Seizures Mother   . Anesthesia problems Neg Hx    History  Substance Use Topics  . Smoking status: Current Every Day Smoker -- 1.50 packs/day    Types: Cigarettes  . Smokeless tobacco: Never Used  . Alcohol Use: No   OB History    Gravida Para Term Preterm AB TAB SAB Ectopic Multiple Living   4 4 3 1  0 0 0 0 0 4      Obstetric Comments   Birth control: Tubal ligation in 2004     Review of Systems  All other systems reviewed and are negative.   Allergies  Shellfish allergy and Hydrocodone  Home Medications   Prior to Admission medications   Medication Sig Start Date End Date Taking? Authorizing Provider  mupirocin ointment (BACTROBAN) 2 % Place 1 application into the nose 2 (two) times daily. X 7 days 06/12/14   Audelia Hives Sheila Gervasi, PA  naproxen sodium (ANAPROX) 220 MG tablet Take 220 mg by mouth 2 (two) times daily with a meal. For menstrual cramps    Historical Provider, MD   BP 154/96 mmHg  Pulse 70  Temp(Src) 98.3 F (36.8 C) (Oral)  Resp 16  SpO2 100%  LMP  05/31/2014 Physical Exam  Constitutional: She is oriented to person, place, and time. She appears well-developed and well-nourished. No distress.  HENT:  Head: Normocephalic and atraumatic.  Nose: Mucosal edema present.  +redess and swelling with lesion covered by yellow crusted material inside left nostril  Eyes: Conjunctivae are normal.  Cardiovascular: Normal rate.   Pulmonary/Chest: Effort normal.  Musculoskeletal: Normal range of motion.  Neurological: She is alert and oriented to person, place, and time.  Psychiatric: She has a normal mood and affect. Her behavior is normal.  Nursing note and vitals reviewed.   ED Course  Procedures (including critical care time) Labs Review Labs Reviewed - No data to display  Imaging Review No results found.   MDM   1. Impetigo    Bactroban inside left nostril BID x 7 days with follow up if no improvement.     Lutricia Feil, [place] 06/12/14 [phone]

## 2014-06-12 NOTE — ED Notes (Signed)
Pt  Reports    Pain  Irritation of  The  Inside of  The l nostril  X  sev  Days      Only  Other symptoms  Is some running  From  The nostril          Pt  Sitting upright on the  Exam table in  No severe  Distress

## 2014-07-12 ENCOUNTER — Encounter (HOSPITAL_COMMUNITY): Payer: Self-pay | Admitting: Emergency Medicine

## 2014-07-12 ENCOUNTER — Emergency Department (HOSPITAL_COMMUNITY)
Admission: EM | Admit: 2014-07-12 | Discharge: 2014-07-12 | Disposition: A | Discharge status: Home / Self Care | Payer: Medicaid Other | Source: Home / Self Care | Attending: Family Medicine | Admitting: Family Medicine

## 2014-07-12 DIAGNOSIS — L0291 Cutaneous abscess, unspecified: Secondary | ICD-10-CM | POA: Diagnosis not present

## 2014-07-12 MED ORDER — LIDOCAINE-EPINEPHRINE (PF) 2 %-1:200000 IJ SOLN
INTRAMUSCULAR | Status: AC
  Filled 2014-07-12: qty 20

## 2014-07-12 MED ORDER — SULFAMETHOXAZOLE-TRIMETHOPRIM 800-160 MG PO TABS: 2.0000 | ORAL_TABLET | Freq: Two times a day (BID) | ORAL | Status: DC

## 2014-07-12 NOTE — ED Provider Notes (Signed)
Mikayla Hansen is a 34 y.o. female who presents to Urgent Care today for abscess. Patient has a small abscess on her right perianal area. Is been present for 2 days. No drainage. No fevers or chills. Patient has tried warm compresses which has not helped. Pain is 10 out of 10. No fevers or chills nausea vomiting or diarrhea.   Past Medical History  Diagnosis Date  . Asthma   . Abnormal Pap smear   . Infection   . Gonorrhea 2007  . Bacterial vaginosis 09-07-2011   Past Surgical History  Procedure Laterality Date  . Tubal ligation  2004  . Wisdom tooth extraction  2009   History  Substance Use Topics  . Smoking status: Current Every Day Smoker -- 1.50 packs/day    Types: Cigarettes  . Smokeless tobacco: Never Used  . Alcohol Use: No   ROS as above Medications: No current facility-administered medications for this encounter.   Current Outpatient Prescriptions  Medication Sig Dispense Refill  . amLODipine (NORVASC) 5 MG tablet Take 5 mg by mouth daily.    . mupirocin ointment (BACTROBAN) 2 % Place 1 application into the nose 2 (two) times daily. X 7 days 22 g 0  . naproxen sodium (ANAPROX) 220 MG tablet Take 220 mg by mouth 2 (two) times daily with a meal. For menstrual cramps    . sulfamethoxazole-trimethoprim (SEPTRA DS) 800-160 MG per tablet Take 2 tablets by mouth 2 (two) times daily. 28 tablet 0   Allergies  Allergen Reactions  . Shellfish Allergy Anaphylaxis  . Hydrocodone Palpitations     Exam:  BP 134/90 mmHg  Pulse 86  Temp(Src) 98.1 F (36.7 C) (Oral)  Resp 16  SpO2 97%  LMP 07/01/2014 (Exact Date) Gen: Well NAD Skin: Tiny fluctuant abscess right perianal area. Tender to palpation. No significant surrounding induration.  Abscess incision and drainage. Consent obtained and timeout performed. Skin cleaned with alcohol and 2 mL of lidocaine with epinephrine injected achieving good anesthesia. Sharp was incision was made overlying the area of  fluctuance. Pus was expressed and wound was bluntly dissected breaking up loculations. A dressing was applied. Patient tolerated the procedure well.  No results found for this or any previous visit (from the past 24 hour(s)). No results found.  Assessment and Plan: 34 y.o. female with abscess. Treatment with Bactrim and incise and drain as above return as needed.  Discussed warning signs or symptoms. Please see discharge instructions. Patient expresses understanding.     Gregor Hams, MD 07/12/14 [phone]

## 2014-07-12 NOTE — Discharge Instructions (Signed)
Thank you for coming in today.  Abscess Care After An abscess (also called a boil or furuncle) is an infected area that contains a collection of pus. Signs and symptoms of an abscess include pain, tenderness, redness, or hardness, or you may feel a moveable soft area under your skin. An abscess can occur anywhere in the body. The infection may spread to surrounding tissues causing cellulitis. A cut (incision) by the surgeon was made over your abscess and the pus was drained out. Gauze may have been packed into the space to provide a drain that will allow the cavity to heal from the inside outwards. The boil may be painful for 5 to 7 days. Most people with a boil do not have high fevers. Your abscess, if seen early, may not have localized, and may not have been lanced. If not, another appointment may be required for this if it does not get better on its own or with medications. HOME CARE INSTRUCTIONS   Only take over-the-counter or prescription medicines for pain, discomfort, or fever as directed by your caregiver.  When you bathe, soak and then remove gauze or iodoform packs at least daily or as directed by your caregiver. You may then wash the wound gently with mild soapy water. Repack with gauze or do as your caregiver directs. SEEK IMMEDIATE MEDICAL CARE IF:   You develop increased pain, swelling, redness, drainage, or bleeding in the wound site.  You develop signs of generalized infection including muscle aches, chills, fever, or a general ill feeling.  An oral temperature above 102 F (38.9 C) develops, not controlled by medication. See your caregiver for a recheck if you develop any of the symptoms described above. If medications (antibiotics) were prescribed, take them as directed. Document Released: 12/21/2004 Document Revised: 08/27/2011 Document Reviewed: 08/18/2007 [employer] Patient Information 2015 [employer]. This information is not intended to replace advice given to you by your  health care provider. Make sure you discuss any questions you have with your health care provider.  

## 2014-07-12 NOTE — ED Notes (Signed)
Pt reports a bump/boil on the right side of her pelvis/groin.  It appeared two days ago and the pt is in a lot of pain 10/10.

## 2014-07-12 NOTE — ED Notes (Signed)
Assisted with an I&D in her vaginal area.  Pt tolerated the procedure well.

## 2014-07-22 ENCOUNTER — Other Ambulatory Visit: Payer: Self-pay

## 2014-07-22 DIAGNOSIS — N644 Mastodynia: Secondary | ICD-10-CM

## 2015-01-05 ENCOUNTER — Encounter (HOSPITAL_COMMUNITY): Payer: Self-pay | Admitting: Emergency Medicine

## 2015-01-05 ENCOUNTER — Emergency Department (HOSPITAL_COMMUNITY)
Admission: EM | Admit: 2015-01-05 | Discharge: 2015-01-05 | Disposition: A | Discharge status: Home / Self Care | Payer: Medicaid Other | Attending: Emergency Medicine | Admitting: Emergency Medicine

## 2015-01-05 ENCOUNTER — Emergency Department (HOSPITAL_COMMUNITY): Payer: Medicaid Other

## 2015-01-05 DIAGNOSIS — R509 Fever, unspecified: Secondary | ICD-10-CM | POA: Insufficient documentation

## 2015-01-05 DIAGNOSIS — R2689 Other abnormalities of gait and mobility: Secondary | ICD-10-CM | POA: Insufficient documentation

## 2015-01-05 DIAGNOSIS — J45901 Unspecified asthma with (acute) exacerbation: Secondary | ICD-10-CM | POA: Insufficient documentation

## 2015-01-05 DIAGNOSIS — Z8619 Personal history of other infectious and parasitic diseases: Secondary | ICD-10-CM | POA: Insufficient documentation

## 2015-01-05 DIAGNOSIS — M79672 Pain in left foot: Secondary | ICD-10-CM | POA: Insufficient documentation

## 2015-01-05 DIAGNOSIS — Z791 Long term (current) use of non-steroidal anti-inflammatories (NSAID): Secondary | ICD-10-CM | POA: Insufficient documentation

## 2015-01-05 DIAGNOSIS — I159 Secondary hypertension, unspecified: Secondary | ICD-10-CM | POA: Insufficient documentation

## 2015-01-05 DIAGNOSIS — Z720 Tobacco use: Secondary | ICD-10-CM | POA: Diagnosis not present

## 2015-01-05 DIAGNOSIS — R079 Chest pain, unspecified: Secondary | ICD-10-CM | POA: Insufficient documentation

## 2015-01-05 DIAGNOSIS — M549 Dorsalgia, unspecified: Secondary | ICD-10-CM | POA: Insufficient documentation

## 2015-01-05 DIAGNOSIS — Z79899 Other long term (current) drug therapy: Secondary | ICD-10-CM | POA: Diagnosis not present

## 2015-01-05 HISTORY — DX: Essential (primary) hypertension: I10

## 2015-01-05 LAB — CBC WITH DIFFERENTIAL/PLATELET
Basophils Absolute: 0 10*3/uL (ref 0.0–0.1)
Basophils Relative: 0 % (ref 0–1)
EOS ABS: 0.1 10*3/uL (ref 0.0–0.7)
Eosinophils Relative: 2 % (ref 0–5)
HEMATOCRIT: 39.1 % (ref 36.0–46.0)
HEMOGLOBIN: 13.2 g/dL (ref 12.0–15.0)
LYMPHS PCT: 21 % (ref 12–46)
Lymphs Abs: 1.4 10*3/uL (ref 0.7–4.0)
MCH: 24 pg — AB (ref 26.0–34.0)
MCHC: 33.8 g/dL (ref 30.0–36.0)
MCV: 71.2 fL — AB (ref 78.0–100.0)
MONO ABS: 0.6 10*3/uL (ref 0.1–1.0)
MONOS PCT: 9 % (ref 3–12)
NEUTROS PCT: 68 % (ref 43–77)
Neutro Abs: 4.8 10*3/uL (ref 1.7–7.7)
Platelets: 276 10*3/uL (ref 150–400)
RBC: 5.49 MIL/uL — AB (ref 3.87–5.11)
RDW: 14.8 % (ref 11.5–15.5)
WBC: 6.9 10*3/uL (ref 4.0–10.5)

## 2015-01-05 LAB — I-STAT TROPONIN, ED
TROPONIN I, POC: 0 ng/mL (ref 0.00–0.08)
Troponin i, poc: 0 ng/mL (ref 0.00–0.08)

## 2015-01-05 LAB — BASIC METABOLIC PANEL
Anion gap: 6 (ref 5–15)
BUN: 13 mg/dL (ref 6–20)
CO2: 27 mmol/L (ref 22–32)
Calcium: 9.4 mg/dL (ref 8.9–10.3)
Chloride: 108 mmol/L (ref 101–111)
Creatinine, Ser: 0.78 mg/dL (ref 0.44–1.00)
GFR calc Af Amer: >60 mL/min (ref >60)
GLUCOSE: 94 mg/dL (ref 65–99)
Potassium: 3.6 mmol/L (ref 3.5–5.1)
Sodium: 141 mmol/L (ref 135–145)

## 2015-01-05 MED ORDER — AMLODIPINE BESYLATE 5 MG PO TABS
5.0000 mg | ORAL_TABLET | Freq: Once | ORAL | Status: AC
  Administered 2015-01-05: 5 mg via ORAL
  Filled 2015-01-05: qty 1

## 2015-01-05 MED ORDER — ASPIRIN 81 MG PO CHEW
324.0000 mg | CHEWABLE_TABLET | Freq: Once | ORAL | Status: AC
  Administered 2015-01-05: 324 mg via ORAL
  Filled 2015-01-05: qty 4

## 2015-01-05 NOTE — Progress Notes (Signed)
Orthopedic Tech Progress Note Patient Details:  Mikayla Hansen [birth year] [id number]  Ortho Devices Type of Ortho Device: ASO Ortho Device/Splint Interventions: Application   Cammer, Theodoro Parma 01/05/2015, 1:40 PM

## 2015-01-05 NOTE — Discharge Instructions (Signed)
Chest Pain (Nonspecific) Follow-up with your primary care provider. Take your hypertension medication as prescribed. Return for any chest pain or shortness of breath. It is often hard to give a specific diagnosis for the cause of chest pain. There is always a chance that your pain could be related to something serious, such as a heart attack or a blood clot in the lungs. You need to follow up with your health care provider for further evaluation. CAUSES   Heartburn.  Pneumonia or bronchitis.  Anxiety or stress.  Inflammation around your heart (pericarditis) or lung (pleuritis or pleurisy).  A blood clot in the lung.  A collapsed lung (pneumothorax). It can develop suddenly on its own (spontaneous pneumothorax) or from trauma to the chest.  Shingles infection (herpes zoster virus). The chest wall is composed of bones, muscles, and cartilage. Any of these can be the source of the pain.  The bones can be bruised by injury.  The muscles or cartilage can be strained by coughing or overwork.  The cartilage can be affected by inflammation and become sore (costochondritis). DIAGNOSIS  Lab tests or other studies may be needed to find the cause of your pain. Your health care provider may have you take a test called an ambulatory electrocardiogram (ECG). An ECG records your heartbeat patterns over a 24-hour period. You may also have other tests, such as:  Transthoracic echocardiogram (TTE). During echocardiography, sound waves are used to evaluate how blood flows through your heart.  Transesophageal echocardiogram (TEE).  Cardiac monitoring. This allows your health care provider to monitor your heart rate and rhythm in real time.  Holter monitor. This is a portable device that records your heartbeat and can help diagnose heart arrhythmias. It allows your health care provider to track your heart activity for several days, if needed.  Stress tests by exercise or by giving medicine that makes  the heart beat faster. TREATMENT   Treatment depends on what may be causing your chest pain. Treatment may include:  Acid blockers for heartburn.  Anti-inflammatory medicine.  Pain medicine for inflammatory conditions.  Antibiotics if an infection is present.  You may be advised to change lifestyle habits. This includes stopping smoking and avoiding alcohol, caffeine, and chocolate.  You may be advised to keep your head raised (elevated) when sleeping. This reduces the chance of acid going backward from your stomach into your esophagus. Most of the time, nonspecific chest pain will improve within 2-3 days with rest and mild pain medicine.  HOME CARE INSTRUCTIONS   If antibiotics were prescribed, take them as directed. Finish them even if you start to feel better.  For the next few days, avoid physical activities that bring on chest pain. Continue physical activities as directed.  Do not use any tobacco products, including cigarettes, chewing tobacco, or electronic cigarettes.  Avoid drinking alcohol.  Only take medicine as directed by your health care provider.  Follow your health care provider's suggestions for further testing if your chest pain does not go away.  Keep any follow-up appointments you made. If you do not go to an appointment, you could develop lasting (chronic) problems with pain. If there is any problem keeping an appointment, call to reschedule. SEEK MEDICAL CARE IF:   Your chest pain does not go away, even after treatment.  You have a rash with blisters on your chest.  You have a fever. SEEK IMMEDIATE MEDICAL CARE IF:   You have increased chest pain or pain that spreads to your  arm, neck, jaw, back, or abdomen.  You have shortness of breath.  You have an increasing cough, or you cough up blood.  You have severe back or abdominal pain.  You feel nauseous or vomit.  You have severe weakness.  You faint.  You have chills. This is an emergency.  Do not wait to see if the pain will go away. Get medical help at once. Call your local emergency services (911 in [place] Do not drive yourself to the hospital. MAKE SURE YOU:   Understand these instructions.  Will watch your condition.  Will get help right away if you are not doing well or get worse. Document Released: 03/14/2005 Document Revised: 06/09/2013 Document Reviewed: 01/08/2008 [facility] Patient Information 2015 [place], [place]. This information is not intended to replace advice given to you by your health care provider. Make sure you discuss any questions you have with your health care provider.

## 2015-01-05 NOTE — ED Provider Notes (Signed)
CSN: [id number]     Arrival date & time 01/05/15  1007 History   First MD Initiated Contact with Patient 01/05/15 1038     Chief Complaint  Patient presents with  . Foot Pain/ chest pain     left     (Consider location/radiation/quality/duration/timing/severity/associated sxs/prior Treatment) The history is provided by the patient. No language interpreter was used.   Mikayla Hansen is a 34 year old female with a history of asthma, STDs, hypertension who presents for chest pain that began this morning which she describes as a sharp stabbing pain that radiates to the left side of her neck. She states that she feel short of breath with the chest pain but no diaphoresis. She is also complaining of left foot pain and swelling for the past 5 days. She states she started a new job which requires her to walk 8 miles a day in a [employer], [employer]. She also openly admits that she has a prescription for her hypertension medication but does not take it and has no reason for this. She smokes daily. She denies any recent surgery, travel, history of DVT or PE, estrogen use. She denies any fever, chills, abdominal pain, nausea, vomiting, or diarrhea.  Past Medical History  Diagnosis Date  . Asthma   . Abnormal Pap smear   . Infection   . Gonorrhea 2007  . Bacterial vaginosis 09-07-2011  . Hypertension    Past Surgical History  Procedure Laterality Date  . Tubal ligation  2004  . Wisdom tooth extraction  2009   Family History  Problem Relation Age of Onset  . Hypertension Mother   . Seizures Mother   . Anesthesia problems Neg Hx    History  Substance Use Topics  . Smoking status: Current Every Day Smoker -- 1.50 packs/day    Types: Cigarettes  . Smokeless tobacco: Never Used  . Alcohol Use: No   OB History    Gravida Para Term Preterm AB TAB SAB Ectopic Multiple Living   4 4 3 1  0 0 0 0 0 4      Obstetric Comments   Birth control: Tubal ligation in 2004     Review of Systems    Constitutional: Positive for fever.  Respiratory: Positive for shortness of breath.   Cardiovascular: Positive for chest pain. Negative for palpitations and leg swelling.  Gastrointestinal: Negative for nausea, vomiting, abdominal pain and diarrhea.  Genitourinary: Negative for dysuria and vaginal discharge.  Musculoskeletal: Positive for myalgias, back pain and gait problem. Negative for neck stiffness.  Neurological: Negative for weakness and numbness.  All other systems reviewed and are negative.     Allergies  Shellfish allergy; Bee venom; and Hydrocodone  Home Medications   Prior to Admission medications   Medication Sig Start Date End Date Taking? Authorizing Provider  albuterol (PROVENTIL HFA) 108 (90 BASE) MCG/ACT inhaler Inhale 2 puffs into the lungs every 6 (six) hours as needed for wheezing.  06/21/14  Yes Historical Provider, MD  amLODipine (NORVASC) 5 MG tablet Take 5 mg by mouth daily.   Yes Historical Provider, MD  busPIRone (BUSPAR) 5 MG tablet Take 2 tablets by mouth daily as needed (anxiety).  06/23/14  Yes Historical Provider, MD  naproxen sodium (ANAPROX) 220 MG tablet Take 220 mg by mouth 2 (two) times daily with a meal. For menstrual cramps   Yes Historical Provider, MD  sulfamethoxazole-trimethoprim (SEPTRA DS) 800-160 MG per tablet Take 2 tablets by mouth 2 (two) times daily. Patient not taking: Reported  on 01/05/2015 07/12/14   Gregor Hams, MD   BP 170/104 mmHg  Pulse 86  Temp(Src) 99.3 F (37.4 C) (Oral)  Resp 16  SpO2 100%  LMP 12/19/2014 Physical Exam  Constitutional: She is oriented to person, place, and time. She appears well-developed and well-nourished.  HENT:  Head: Normocephalic and atraumatic.  Eyes: Conjunctivae are normal.  Neck: Normal range of motion. Neck supple.  Cardiovascular: Normal rate, regular rhythm and normal heart sounds.   Pulmonary/Chest: Effort normal and breath sounds normal. No respiratory distress. She has no wheezes. She  has no rales.   She exhibits no tenderness.  Reproducible right sided thoracic paravertebral tenderness to palpation as diagrammed.  Abdominal: Soft. There is no tenderness.  Musculoskeletal: Normal range of motion. She exhibits edema and tenderness.  Medial left foot swelling along the middle first metatarsal with tenderness to palpation. No ecchymosis or erythema. Ambulatory with limping gait.  No calf swelling or tenderness.  Neurological: She is alert and oriented to person, place, and time.  Skin: Skin is warm and dry.  Psychiatric: She has a normal mood and affect. Her behavior is normal.  Nursing note and vitals reviewed.   ED Course  Procedures (including critical care time) Labs Review Labs Reviewed  CBC WITH DIFFERENTIAL/PLATELET - Abnormal; Notable for the following:    RBC 5.49 (*)    MCV 71.2 (*)    MCH 24.0 (*)    All other components within normal limits  BASIC METABOLIC PANEL  I-STAT TROPOININ, ED  Randolm Idol, ED    Imaging Review Dg Chest 2 View  01/05/2015   CLINICAL DATA:  Five day history of mid chest pain  EXAM: CHEST  2 VIEW  COMPARISON:  April 27, 2014  FINDINGS: Lungs are clear. Heart size and pulmonary vascularity are normal. No adenopathy. No pneumothorax. No bone lesions.  IMPRESSION: No abnormality noted.   Electronically Signed   By: Lowella Grip III M.D.   On: 01/05/2015 11:11   Dg Foot Complete Left  01/05/2015   CLINICAL DATA:  Left medial foot pain for 2 days.  EXAM: LEFT FOOT - COMPLETE 3+ VIEW  COMPARISON:  None.  FINDINGS: There is no evidence of fracture or dislocation. There is no evidence of arthropathy or other focal bone abnormality. Minimal spur (enthesophyte) noted along the inferior margin of the posterior calcaneus. Soft tissues are unremarkable.  IMPRESSION: Unremarkable plain film of the left foot. No acute findings. No fracture or dislocation. No significant degenerative change.   Electronically Signed   By: Franki Cabot  M.D.   On: 01/05/2015 11:14     EKG Interpretation   Date/Time:  Wednesday January 05 2015 10:34:04 EDT Ventricular Rate:  82 PR Interval:  144 QRS Duration: 76 QT Interval:  359 QTC Calculation: 419 R Axis:   81 Text Interpretation:  Sinus rhythm Low voltage, precordial leads  Anteroseptal infarct, old No significant change since last tracing  Confirmed by Wilson Singer  MD, STEPHEN ([id number]) on 01/05/2015 12:15:10 PM      MDM   Final diagnoses:  Chest pain, unspecified chest pain type  Left foot pain  Secondary hypertension, unspecified   Patient presents for chest pain and left foot swelling and pain. She is noncompliant with hypertension (Norvasc) medication. She is well appearing and in no acute distress.  Her EKG is not concerning and her troponin is negative. Labs are unremarkable. Chest x-ray is negative for pneumonia, edema, pneumothorax. Left foot x-ray is negative for acute  fracture or dislocation. PERC negative. Heart score of 2. Repeat troponin was negative. Medications  amLODipine (NORVASC) tablet 5 mg (5 mg Oral Given 01/05/15 1107)  aspirin chewable tablet 324 mg (324 mg Oral Given 01/05/15 1108)   I discussed the importance of taking her blood pressure medication on a regular basis. She was hypertensive during her ED visit. I also discussed following up with her primary care physician regarding swelling in her feet and her blood pressure. Patient verbally agrees with the plan. She was also given an ASO splint for her left ankle. I explained that she should elevate her legs at night to keep the fluid from building up.    Ottie Glazier, PA-C 01/05/15 [place], MD 01/06/15 [phone]

## 2015-01-05 NOTE — ED Notes (Addendum)
Pt reports left foot pain with ankle pain. Pt denies injury  Or fall, yet sts that she walks aprox 8 miles during work day. Also reports paib radiation to knee and worse while walking. Hx HTN yet non-compliant with meds. Pt reports chest pain radiating to upper back.

## 2015-04-28 ENCOUNTER — Encounter: Payer: Self-pay | Admitting: Certified Nurse Midwife

## 2015-04-28 ENCOUNTER — Ambulatory Visit (INDEPENDENT_AMBULATORY_CARE_PROVIDER_SITE_OTHER): Payer: Medicaid Other | Admitting: Certified Nurse Midwife

## 2015-04-28 ENCOUNTER — Other Ambulatory Visit: Payer: Self-pay | Admitting: Certified Nurse Midwife

## 2015-04-28 VITALS — BP 153/102 | HR 86 | Temp 98.5°F | Ht 64.0 in | Wt 188.0 lb

## 2015-04-28 DIAGNOSIS — Z01419 Encounter for gynecological examination (general) (routine) without abnormal findings: Secondary | ICD-10-CM | POA: Diagnosis not present

## 2015-04-28 DIAGNOSIS — K5901 Slow transit constipation: Secondary | ICD-10-CM

## 2015-04-28 DIAGNOSIS — N63 Unspecified lump in unspecified breast: Secondary | ICD-10-CM

## 2015-04-28 DIAGNOSIS — Z113 Encounter for screening for infections with a predominantly sexual mode of transmission: Secondary | ICD-10-CM

## 2015-04-28 DIAGNOSIS — K640 First degree hemorrhoids: Secondary | ICD-10-CM

## 2015-04-28 DIAGNOSIS — Z0000 Encounter for general adult medical examination without abnormal findings: Secondary | ICD-10-CM

## 2015-04-28 DIAGNOSIS — N939 Abnormal uterine and vaginal bleeding, unspecified: Secondary | ICD-10-CM

## 2015-04-28 LAB — CBC WITH DIFFERENTIAL/PLATELET
BASOS PCT: 0 % (ref 0–1)
Basophils Absolute: 0 10*3/uL (ref 0.0–0.1)
EOS ABS: 0.2 10*3/uL (ref 0.0–0.7)
Eosinophils Relative: 2 % (ref 0–5)
HCT: 41.5 % (ref 36.0–46.0)
HEMOGLOBIN: 13.8 g/dL (ref 12.0–15.0)
Lymphocytes Relative: 25 % (ref 12–46)
Lymphs Abs: 2.2 10*3/uL (ref 0.7–4.0)
MCH: 23.6 pg — ABNORMAL LOW (ref 26.0–34.0)
MCHC: 33.3 g/dL (ref 30.0–36.0)
MCV: 71.1 fL — ABNORMAL LOW (ref 78.0–100.0)
MPV: 10.4 fL (ref 8.6–12.4)
Monocytes Absolute: 0.5 10*3/uL (ref 0.1–1.0)
Monocytes Relative: 6 % (ref 3–12)
NEUTROS PCT: 67 % (ref 43–77)
Neutro Abs: 5.8 10*3/uL (ref 1.7–7.7)
PLATELETS: 312 10*3/uL (ref 150–400)
RBC: 5.84 MIL/uL — AB (ref 3.87–5.11)
RDW: 15.7 % — ABNORMAL HIGH (ref 11.5–15.5)
WBC: 8.7 10*3/uL (ref 4.0–10.5)

## 2015-04-28 LAB — TRIGLYCERIDES: Triglycerides: 65 mg/dL (ref <150)

## 2015-04-28 LAB — COMPREHENSIVE METABOLIC PANEL
ALBUMIN: 4.4 g/dL (ref 3.6–5.1)
ALT: 14 U/L (ref 6–29)
AST: 15 U/L (ref 10–30)
Alkaline Phosphatase: 66 U/L (ref 33–115)
BILIRUBIN TOTAL: 0.3 mg/dL (ref 0.2–1.2)
BUN: 10 mg/dL (ref 7–25)
CO2: 28 mmol/L (ref 20–31)
CREATININE: 0.68 mg/dL (ref 0.50–1.10)
Calcium: 9.7 mg/dL (ref 8.6–10.2)
Chloride: 104 mmol/L (ref 98–110)
Glucose, Bld: 94 mg/dL (ref 65–99)
Potassium: 4.4 mmol/L (ref 3.5–5.3)
SODIUM: 139 mmol/L (ref 135–146)
TOTAL PROTEIN: 7.6 g/dL (ref 6.1–8.1)

## 2015-04-28 LAB — HDL CHOLESTEROL: HDL: 50 mg/dL (ref >=46)

## 2015-04-28 LAB — TSH: TSH: 1.564 u[IU]/mL (ref 0.350–4.500)

## 2015-04-28 LAB — CHOLESTEROL, TOTAL: CHOLESTEROL: 181 mg/dL (ref 125–200)

## 2015-04-28 MED ORDER — DOCUSATE SODIUM 100 MG PO CAPS: 100.0000 mg | ORAL_CAPSULE | Freq: Two times a day (BID) | ORAL | Status: DC

## 2015-04-28 MED ORDER — HYDROCORTISONE ACETATE 25 MG RE SUPP: 25.0000 mg | Freq: Two times a day (BID) | RECTAL | Status: DC

## 2015-04-28 MED ORDER — POLYETHYLENE GLYCOL 3350 17 GM/SCOOP PO POWD: 17.0000 g | Freq: Every day | ORAL | Status: DC

## 2015-04-28 NOTE — Progress Notes (Signed)
Patient ID: Mikayla Hansen, female   DOB: Jul 05, 1980, 34 y.o.   MRN: [id number]    Subjective:      Mikayla Hansen is a 34 y.o. female here for a routine exam.  Current complaints: patient reports hx of elevated blood pressure.  Has not been taking medications for B/P.   Encouraged f/u with PCP for blood pressure.  Menses: monthly, lasting 4-6 days, menorrhagia, cramping, and large fist sized clots.  Desires full STD screening exam.   Personal health questionnaire:  Is patient Ashkenazi Jewish, have a family history of breast and/or ovarian cancer: no Is there a family history of uterine cancer diagnosed at age < 58, gastrointestinal cancer, urinary tract cancer, family member who is a Field seismologist syndrome-associated carrier: no Is the patient overweight and hypertensive, family history of diabetes, personal history of gestational diabetes, preeclampsia or PCOS: yes Is patient over 36, have PCOS,  family history of premature CHD under age 31, diabetes, smoke, have hypertension or peripheral artery disease:  Yes, MFM had CVA & bypass.   At any time, has a partner hit, kicked or otherwise hurt or frightened you?: no Over the past 2 weeks, have you felt down, depressed or hopeless?: no Over the past 2 weeks, have you felt little interest or pleasure in doing things?:no   Gynecologic History Patient's last menstrual period was 03/31/2015. Contraception: tubal ligation Last Pap: 09/08/11. Results were: normal Last mammogram: N/A.   Obstetric History OB History  Gravida Para Term Preterm AB SAB TAB Ectopic Multiple Living  4 4 3 1  0 0 0 0 0 4    # Outcome Date GA Lbr Len/2nd Weight Sex Delivery Anes PTL Lv  4 Term           3 Term           2 Term           1 Preterm             Obstetric Comments  Birth control: Tubal ligation in 2004    Past Medical History  Diagnosis Date  . Asthma   . Abnormal Pap smear   . Infection   . Gonorrhea 2007  . Bacterial vaginosis 09-07-2011   . Hypertension     Past Surgical History  Procedure Laterality Date  . Tubal ligation  2004  . Wisdom tooth extraction  2009     Current outpatient prescriptions:  .  albuterol (PROVENTIL HFA) 108 (90 BASE) MCG/ACT inhaler, Inhale 2 puffs into the lungs every 6 (six) hours as needed for wheezing. , Disp: , Rfl:  .  amLODipine (NORVASC) 5 MG tablet, Take 5 mg by mouth daily., Disp: , Rfl:  .  busPIRone (BUSPAR) 5 MG tablet, Take 2 tablets by mouth daily as needed (anxiety). , Disp: , Rfl:  .  docusate sodium (COLACE) 100 MG capsule, Take 1 capsule (100 mg total) by mouth 2 (two) times daily., Disp: 10 capsule, Rfl: 0 .  hydrocortisone (ANUSOL-HC) 25 MG suppository, Place 1 suppository (25 mg total) rectally 2 (two) times daily., Disp: 12 suppository, Rfl: 0 .  polyethylene glycol powder (MIRALAX) powder, Take 17 g by mouth daily., Disp: 500 g, Rfl: 0 Allergies  Allergen Reactions  . Shellfish Allergy Anaphylaxis  . Bee Venom     Swelling hives  . Hydrocodone Palpitations    Social History  Substance Use Topics  . Smoking status: Current Every Day Smoker -- 1.50 packs/day    Types: Cigarettes  .  Smokeless tobacco: Never Used  . Alcohol Use: No    Family History  Problem Relation Age of Onset  . Hypertension Mother   . Seizures Mother   . Anesthesia problems Neg Hx       Review of Systems  Constitutional: negative for fatigue and weight loss Respiratory: negative for cough and wheezing Cardiovascular: negative for chest pain, fatigue and palpitations Gastrointestinal: negative for abdominal pain and change in bowel habits Musculoskeletal:negative for myalgias Neurological: negative for gait problems and tremors Behavioral/Psych: negative for abusive relationship, depression Endocrine: negative for temperature intolerance   Genitourinary:negative for abnormal menstrual periods, genital lesions, hot flashes, sexual problems and vaginal discharge Integument/breast:  negative for breast lump, breast tenderness, nipple discharge and skin lesion(s)    Objective:       BP 153/102 mmHg  Pulse 86  Temp(Src) 98.5 F (36.9 C)  Ht 5\' 4"  (1.626 m)  Wt 188 lb (85.276 kg)  BMI 32.25 kg/m2  LMP 03/31/2015 General:   alert  Skin:   no rash or abnormalities  Lungs:   clear to auscultation bilaterally  Heart:   regular rate and rhythm, S1, S2 normal, no murmur, click, rub or gallop  Breasts:   no skin or nipple changes or axillary nodes, + breast masses bilaterally Right breast around the 12 o'clock region, left breast around the 2 o'clock region  Abdomen:  normal findings: no organomegaly, soft, non-tender and no hernia  Pelvis:  External genitalia: normal general appearance Urinary system: urethral meatus normal and bladder without fullness, nontender Vaginal: normal without tenderness, induration or masses Cervix: normal appearance Adnexa: normal bimanual exam Uterus: anteverted and non-tender, normal size   Lab Review Urine pregnancy test Labs reviewed yes Radiologic studies reviewed no  50% of 30 min visit spent on counseling and coordination of care.   Assessment:    Healthy female exam.    AUB  Hypertension  STD screening exam Constipation   Plan:    Education reviewed: depression evaluation, low fat, low cholesterol diet, safe sex/STD prevention, self breast exams, skin cancer screening and weight bearing exercise. Follow up in: 1 month.   Meds ordered this encounter  Medications  . docusate sodium (COLACE) 100 MG capsule    Sig: Take 1 capsule (100 mg total) by mouth 2 (two) times daily.    Dispense:  10 capsule    Refill:  0  . polyethylene glycol powder (MIRALAX) powder    Sig: Take 17 g by mouth daily.    Dispense:  500 g    Refill:  0  . hydrocortisone (ANUSOL-HC) 25 MG suppository    Sig: Place 1 suppository (25 mg total) rectally 2 (two) times daily.    Dispense:  12 suppository    Refill:  0   Orders Placed This  Encounter  Procedures  . SureSwab, Vaginosis/Vaginitis Plus  . MM Digital Diagnostic Bilat    EPIC ORDER/PREV U/S BREAST 2009/ NOT PREGNANT/NO IMPLANTS/NO FAMILY OF BREAST CA/INS-MEDICAID/CLC/BARB    Standing Status: Future     Number of Occurrences:      Standing Expiration Date: 06/27/2016    Order Specific Question:  Reason for Exam (SYMPTOM  OR DIAGNOSIS REQUIRED)    Answer:  RT MASS MASS AT [facility] AND LT BREAT MASS AT 12OCLOCK    Order Specific Question:  Is the patient pregnant?    Answer:  No    Order Specific Question:  Preferred imaging location?    Answer:  [facility]  . US  Transvaginal Non-OB    Standing Status: Future     Number of Occurrences:      Standing Expiration Date: 06/27/2016    Order Specific Question:  Reason for Exam (SYMPTOM  OR DIAGNOSIS REQUIRED)    Answer:  AUB    Order Specific Question:  Preferred imaging location?    Answer:  [facility]  . US Pelvis Complete    Standing Status: Future     Number of Occurrences:      Standing Expiration Date: 06/27/2016    Order Specific Question:  Reason for Exam (SYMPTOM  OR DIAGNOSIS REQUIRED)    Answer:  AUB    Order Specific Question:  Preferred imaging location?    Answer:  [facility]  . HIV antibody (with reflex)  . Hepatitis B surface antigen  . RPR  . Hepatitis C antibody  . CBC with Differential/Platelet  . Comprehensive metabolic panel  . TSH  . Cholesterol, total  . Triglycerides  . HDL cholesterol  . Ambulatory referral to Gastroenterology    Referral Priority:  Routine    Referral Type:  Consultation    Referral Reason:  Specialty Services Required    Number of Visits Requested:  1   Possible management options include: Mirena IUD for AUB

## 2015-04-29 LAB — HEPATITIS B SURFACE ANTIGEN: Hepatitis B Surface Ag: NEGATIVE

## 2015-04-29 LAB — RPR

## 2015-04-29 LAB — HIV ANTIBODY (ROUTINE TESTING W REFLEX): HIV 1&2 Ab, 4th Generation: NONREACTIVE

## 2015-04-29 LAB — HEPATITIS C ANTIBODY: HCV AB: NEGATIVE

## 2015-05-02 ENCOUNTER — Ambulatory Visit (HOSPITAL_COMMUNITY)
Admission: RE | Admit: 2015-05-02 | Discharge: 2015-05-02 | Disposition: A | Discharge status: Home / Self Care | Payer: Medicaid Other | Source: Ambulatory Visit | Attending: Certified Nurse Midwife | Admitting: Certified Nurse Midwife

## 2015-05-02 DIAGNOSIS — D251 Intramural leiomyoma of uterus: Secondary | ICD-10-CM | POA: Insufficient documentation

## 2015-05-02 DIAGNOSIS — N939 Abnormal uterine and vaginal bleeding, unspecified: Secondary | ICD-10-CM | POA: Diagnosis present

## 2015-05-02 DIAGNOSIS — R102 Pelvic and perineal pain: Secondary | ICD-10-CM | POA: Insufficient documentation

## 2015-05-02 DIAGNOSIS — N920 Excessive and frequent menstruation with regular cycle: Secondary | ICD-10-CM | POA: Insufficient documentation

## 2015-05-02 DIAGNOSIS — Z9851 Tubal ligation status: Secondary | ICD-10-CM | POA: Insufficient documentation

## 2015-05-02 LAB — SURESWAB, VAGINOSIS/VAGINITIS PLUS
Atopobium vaginae: 6.4 Log (cells/mL)
C. ALBICANS, DNA: NOT DETECTED
C. GLABRATA, DNA: NOT DETECTED
C. TRACHOMATIS RNA, TMA: NOT DETECTED
C. parapsilosis, DNA: NOT DETECTED
C. tropicalis, DNA: NOT DETECTED
Gardnerella vaginalis: 8 Log (cells/mL)
LACTOBACILLUS SPECIES: NOT DETECTED Log (cells/mL)
MEGASPHAERA SPECIES: 7.1 Log (cells/mL)
N. gonorrhoeae RNA, TMA: NOT DETECTED
T. vaginalis RNA, QL TMA: NOT DETECTED

## 2015-05-03 LAB — PAP, TP IMAGING W/ HPV RNA, RFLX HPV TYPE 16,18/45: HPV mRNA, High Risk: NOT DETECTED

## 2015-05-04 ENCOUNTER — Other Ambulatory Visit: Payer: Self-pay | Admitting: Certified Nurse Midwife

## 2015-05-04 DIAGNOSIS — N760 Acute vaginitis: Principal | ICD-10-CM

## 2015-05-04 DIAGNOSIS — B9689 Other specified bacterial agents as the cause of diseases classified elsewhere: Secondary | ICD-10-CM

## 2015-05-04 MED ORDER — METRONIDAZOLE 500 MG PO TABS: 500.0000 mg | ORAL_TABLET | Freq: Two times a day (BID) | ORAL | Status: DC

## 2015-05-05 ENCOUNTER — Ambulatory Visit
Admission: RE | Admit: 2015-05-05 | Discharge: 2015-05-05 | Disposition: A | Discharge status: Home / Self Care | Payer: Medicaid Other | Source: Ambulatory Visit | Attending: Certified Nurse Midwife | Admitting: Certified Nurse Midwife

## 2015-05-05 ENCOUNTER — Other Ambulatory Visit: Payer: Self-pay | Admitting: Certified Nurse Midwife

## 2015-05-05 DIAGNOSIS — N63 Unspecified lump in unspecified breast: Secondary | ICD-10-CM

## 2015-05-06 ENCOUNTER — Other Ambulatory Visit: Payer: Self-pay | Admitting: Certified Nurse Midwife

## 2015-05-06 DIAGNOSIS — D259 Leiomyoma of uterus, unspecified: Secondary | ICD-10-CM | POA: Insufficient documentation

## 2015-05-20 ENCOUNTER — Encounter: Payer: Self-pay | Admitting: Certified Nurse Midwife

## 2015-05-20 ENCOUNTER — Ambulatory Visit (INDEPENDENT_AMBULATORY_CARE_PROVIDER_SITE_OTHER): Payer: Medicaid Other | Admitting: Certified Nurse Midwife

## 2015-05-20 VITALS — BP 154/106 | HR 97 | Temp 98.5°F | Wt 189.0 lb

## 2015-05-20 DIAGNOSIS — I10 Essential (primary) hypertension: Secondary | ICD-10-CM | POA: Diagnosis not present

## 2015-05-20 DIAGNOSIS — N939 Abnormal uterine and vaginal bleeding, unspecified: Secondary | ICD-10-CM

## 2015-05-20 DIAGNOSIS — D259 Leiomyoma of uterus, unspecified: Secondary | ICD-10-CM | POA: Diagnosis not present

## 2015-05-20 MED ORDER — APAP-PAMABROM-PYRILAMINE 500-25-15 MG PO TABS: 2.0000 | ORAL_TABLET | Freq: Four times a day (QID) | ORAL | Status: DC | PRN

## 2015-05-20 MED ORDER — IBUPROFEN 800 MG PO TABS: 800.0000 mg | ORAL_TABLET | Freq: Three times a day (TID) | ORAL | Status: DC | PRN

## 2015-05-20 NOTE — Progress Notes (Signed)
Patient ID: Mikayla Hansen, female   DOB: July 29, 1980, 34 y.o.   MRN: [id number]   Chief Complaint  Patient presents with  . Follow-up    Ultrasound Results    HPI Mikayla Hansen is a 34 y.o. female.  Here for f/u on ultrasound results.  2 small fibroids seen.  Discussed results with patient and recommended f/u with Mirena IUD with next period.  Patient states that she will think about it.  Has been taking 8 naproxen during her periods per day.  Educated her that was not a good idea.  Discussed Ibuprofen and Midol tx with patient.  Discussed HTN, uncontrolled.  States that she is making an appointment today.    HPI  Past Medical History  Diagnosis Date  . Asthma   . Abnormal Pap smear   . Infection   . Gonorrhea 2007  . Bacterial vaginosis 09-07-2011  . Hypertension     Past Surgical History  Procedure Laterality Date  . Tubal ligation  2004  . Wisdom tooth extraction  2009    Family History  Problem Relation Age of Onset  . Hypertension Mother   . Seizures Mother   . Anesthesia problems Neg Hx     Social History Social History  Substance Use Topics  . Smoking status: Current Every Day Smoker -- 1.50 packs/day    Types: Cigarettes  . Smokeless tobacco: Never Used  . Alcohol Use: No    Allergies  Allergen Reactions  . Shellfish Allergy Anaphylaxis  . Bee Venom     Swelling hives  . Hydrocodone Palpitations    Current Outpatient Prescriptions  Medication Sig Dispense Refill  . albuterol (PROVENTIL HFA) 108 (90 BASE) MCG/ACT inhaler Inhale 2 puffs into the lungs every 6 (six) hours as needed for wheezing.     Marland Kitchen amLODipine (NORVASC) 5 MG tablet Take 5 mg by mouth daily.    Marland Kitchen docusate sodium (COLACE) 100 MG capsule Take 1 capsule (100 mg total) by mouth 2 (two) times daily. 10 capsule 0  . APAP-Pamabrom-Pyrilamine 500-25-15 MG TABS Take 2 tablets by mouth every 6 (six) hours as needed. 90 each 4  . busPIRone (BUSPAR) 5 MG tablet Take 2 tablets by mouth  daily as needed (anxiety).     . hydrocortisone (ANUSOL-HC) 25 MG suppository Place 1 suppository (25 mg total) rectally 2 (two) times daily. (Patient not taking: Reported on 05/20/2015) 12 suppository 0  . ibuprofen (ADVIL,MOTRIN) 800 MG tablet Take 1 tablet (800 mg total) by mouth every 8 (eight) hours as needed. 60 tablet 1   No current facility-administered medications for this visit.    Review of Systems Review of Systems Constitutional: negative for fatigue and weight loss Respiratory: negative for cough and wheezing Cardiovascular: negative for chest pain, fatigue and palpitations Gastrointestinal: negative for abdominal pain and change in bowel habits Genitourinary:negative Integument/breast: negative for nipple discharge Musculoskeletal:negative for myalgias Neurological: negative for gait problems and tremors Behavioral/Psych: negative for abusive relationship, depression Endocrine: negative for temperature intolerance     Blood pressure 154/106, pulse 97, temperature 98.5 F (36.9 C), weight 189 lb (85.73 kg), last menstrual period 05/06/2015.  Physical Exam Physical Exam General:   alert  Skin:   no rash or abnormalities  Lungs:   clear to auscultation bilaterally  Heart:   regular rate and rhythm, S1, S2 normal, no murmur, click, rub or gallop  Breasts:   normal without suspicious masses, skin or nipple changes or axillary nodes  Abdomen:  normal  findings: no organomegaly, soft, non-tender and no hernia  Pelvis:  External genitalia: normal general appearance Urinary system: urethral meatus normal and bladder without fullness, nontender Vaginal: normal without tenderness, induration or masses Cervix: normal appearance Adnexa: normal bimanual exam Uterus: anteverted and non-tender, normal size    100% of 15 min visit spent on counseling and coordination of care.   Data Reviewed Previous medical hx, meds, [place]   Assessment     Uterine fibroids AUB Uncontrolled  HTN     Plan    No orders of the defined types were placed in this encounter.   Meds ordered this encounter  Medications  . ibuprofen (ADVIL,MOTRIN) 800 MG tablet    Sig: Take 1 tablet (800 mg total) by mouth every 8 (eight) hours as needed.    Dispense:  60 tablet    Refill:  1  . APAP-Pamabrom-Pyrilamine 500-25-15 MG TABS    Sig: Take 2 tablets by mouth every 6 (six) hours as needed.    Dispense:  90 each    Refill:  4    Possible management options include: Lysteda Follow up with Mirena IUD on next period.

## 2015-06-01 ENCOUNTER — Ambulatory Visit: Payer: Medicaid Other | Admitting: Certified Nurse Midwife

## 2015-08-30 ENCOUNTER — Emergency Department (HOSPITAL_COMMUNITY)
Admission: EM | Admit: 2015-08-30 | Discharge: 2015-08-30 | Disposition: A | Discharge status: Home / Self Care | Payer: Medicaid Other | Attending: Emergency Medicine | Admitting: Emergency Medicine

## 2015-08-30 ENCOUNTER — Encounter (HOSPITAL_COMMUNITY): Payer: Self-pay | Admitting: Emergency Medicine

## 2015-08-30 ENCOUNTER — Emergency Department (HOSPITAL_COMMUNITY): Payer: Medicaid Other

## 2015-08-30 DIAGNOSIS — R202 Paresthesia of skin: Secondary | ICD-10-CM | POA: Diagnosis not present

## 2015-08-30 DIAGNOSIS — Z8619 Personal history of other infectious and parasitic diseases: Secondary | ICD-10-CM | POA: Diagnosis not present

## 2015-08-30 DIAGNOSIS — Z8742 Personal history of other diseases of the female genital tract: Secondary | ICD-10-CM | POA: Diagnosis not present

## 2015-08-30 DIAGNOSIS — F17210 Nicotine dependence, cigarettes, uncomplicated: Secondary | ICD-10-CM | POA: Insufficient documentation

## 2015-08-30 DIAGNOSIS — M79675 Pain in left toe(s): Secondary | ICD-10-CM

## 2015-08-30 DIAGNOSIS — J45909 Unspecified asthma, uncomplicated: Secondary | ICD-10-CM | POA: Diagnosis not present

## 2015-08-30 DIAGNOSIS — Z79899 Other long term (current) drug therapy: Secondary | ICD-10-CM | POA: Insufficient documentation

## 2015-08-30 DIAGNOSIS — I10 Essential (primary) hypertension: Secondary | ICD-10-CM | POA: Insufficient documentation

## 2015-08-30 MED ORDER — KETOROLAC TROMETHAMINE 60 MG/2ML IM SOLN
60.0000 mg | Freq: Once | INTRAMUSCULAR | Status: AC
  Administered 2015-08-30: 60 mg via INTRAMUSCULAR
  Filled 2015-08-30: qty 2

## 2015-08-30 NOTE — ED Notes (Signed)
Pt reports striking her L big toe x 2 days ago.  Pt ambulatory with a limp.  No obvious deformity noted at this time

## 2015-08-30 NOTE — ED Notes (Signed)
Patient states her left foot began hurting 3 days ago after striking her second toe on a tree stump. Patient states the pain is worse over the great toe and has significantly decreased her mobility. Patient has not taken anything for pain at home. A&Ox4

## 2015-08-30 NOTE — Discharge Instructions (Signed)
You have been seen today for toe pain. Your imaging showed no abnormalities. It is suspected that you have a sprain. Follow up with PCP as needed if symptoms continue. Return to ED should symptoms worsen. Rest, ice, elevation, and anti-inflammatory medications, such as ibuprofen, are the indicated treatment for this issue. Take 800 mg of ibuprofen every 8 hours for the next 3 days. Take the ibuprofen with food to avoid an upset stomach.

## 2015-08-30 NOTE — ED Provider Notes (Signed)
CSN: [id number]     Arrival date & time 08/30/15  0331 History   First MD Initiated Contact with Patient 08/30/15 [phone]     Chief Complaint  Patient presents with  . Foot Pain    left     (Consider location/radiation/quality/duration/timing/severity/associated sxs/prior Treatment) HPI   RHIANNON BRIEN is a 35 y.o. female, with a history of hypertension, presenting to the ED with left foot pain that began 3 days ago. Patient states that she struck her foot on tree stump, initially had pain in her second toe but now has pain in her first toe. Patient rates her pain at 8 out of 10, throbbing, nonradiating. Patient has not taken any medication prior to arrival. Patient endorses some tingling in the toe. Patient denies weakness/numbness, LOC, head trauma, or any other pain, injuries, or complaints.    Past Medical History  Diagnosis Date  . Asthma   . Abnormal Pap smear   . Infection   . Gonorrhea 2007  . Bacterial vaginosis 09-07-2011  . Hypertension    Past Surgical History  Procedure Laterality Date  . Tubal ligation  2004  . Wisdom tooth extraction  2009   Family History  Problem Relation Age of Onset  . Hypertension Mother   . Seizures Mother   . Anesthesia problems Neg Hx    Social History  Substance Use Topics  . Smoking status: Current Every Day Smoker -- 0.75 packs/day    Types: Cigarettes  . Smokeless tobacco: Never Used  . Alcohol Use: 0.0 oz/week    0 Standard drinks or equivalent per week     Comment: occ   OB History    Gravida Para Term Preterm AB TAB SAB Ectopic Multiple Living   4 4 3 1  0 0 0 0 0 4      Obstetric Comments   Birth control: Tubal ligation in 2004     Review of Systems  Musculoskeletal: Positive for arthralgias (Left great toe). Negative for joint swelling.  Skin: Negative for wound.  Neurological: Negative for weakness and numbness.      Allergies  Shellfish allergy; Bee venom; and Hydrocodone  Home Medications   Prior  to Admission medications   Medication Sig Start Date End Date Taking? Authorizing Provider  albuterol (PROVENTIL HFA) 108 (90 BASE) MCG/ACT inhaler Inhale 2 puffs into the lungs every 6 (six) hours as needed for wheezing.  06/21/14   Historical Provider, MD  amLODipine (NORVASC) 5 MG tablet Take 5 mg by mouth daily.    Historical Provider, MD  APAP-Pamabrom-Pyrilamine 500-25-15 MG TABS Take 2 tablets by mouth every 6 (six) hours as needed. 05/20/15   Rachelle A Denney, CNM  busPIRone (BUSPAR) 5 MG tablet Take 2 tablets by mouth daily as needed (anxiety).  06/23/14   Historical Provider, MD  docusate sodium (COLACE) 100 MG capsule Take 1 capsule (100 mg total) by mouth 2 (two) times daily. 04/28/15   Rachelle A Denney, CNM  hydrocortisone (ANUSOL-HC) 25 MG suppository Place 1 suppository (25 mg total) rectally 2 (two) times daily. Patient not taking: Reported on 05/20/2015 04/28/15   Rachelle A Denney, CNM  ibuprofen (ADVIL,MOTRIN) 800 MG tablet Take 1 tablet (800 mg total) by mouth every 8 (eight) hours as needed. 05/20/15   Rachelle A Denney, CNM   BP 160/120 mmHg  Pulse 87  Temp(Src) 98.2 F (36.8 C) (Oral)  Resp 18  Ht 5\' 4"  (1.626 m)  Wt 87.091 kg  BMI 32.94 kg/m2  SpO2  99%  LMP 08/24/2015 (Approximate) Physical Exam  Constitutional: She appears well-developed and well-nourished. No distress.  HENT:  Head: Normocephalic and atraumatic.  Eyes: Conjunctivae are normal.  Cardiovascular: Normal rate and regular rhythm.   Pulmonary/Chest: Effort normal.  Musculoskeletal:  Tenderness to the left great toe. No discernible swelling or erythema. CMS is intact.  Neurological: She is alert.  Skin: Skin is warm and dry. She is not diaphoretic.  Nursing note and vitals reviewed.   ED Course  Procedures (including critical care time) Labs Review Labs Reviewed - No data to display  Imaging Review Dg Foot Complete Left  08/30/2015  CLINICAL DATA:  Foot pain after injury on tree stump 3 days  ago. Initial encounter. EXAM: LEFT FOOT - COMPLETE 3+ VIEW COMPARISON:  01/05/2015 FINDINGS: There is no evidence of fracture or dislocation. Soft tissues are unremarkable. IMPRESSION: Negative. Electronically Signed   By: Monte Fantasia M.D.   On: 08/30/2015 04:28   I have personally reviewed and evaluated these images as part of my medical decision-making.   EKG Interpretation None      MDM   Final diagnoses:  Pain of toe of left foot    Romie Levee Wallington presents with left foot pain that occurred from an injury 3 days ago.  This patient's presentation warrants an x-ray of the left foot. X-rays negative for abnormalities. Suspect sprain. Home care and return precautions discussed. Patient to follow up with PCP should symptoms continue. Patient voiced understanding of these instructions and is comfortable with discharge.    Lorayne Bender, PA-C 08/30/15 0748  Julianne Rice, MD 09/01/15 [phone]

## 2016-06-13 ENCOUNTER — Telehealth: Payer: Self-pay

## 2016-06-13 NOTE — Telephone Encounter (Signed)
Called pt left detailed vm on cell about changes to appt date/time.

## 2016-06-19 ENCOUNTER — Ambulatory Visit: Payer: Medicaid Other | Admitting: Certified Nurse Midwife

## 2016-06-25 ENCOUNTER — Ambulatory Visit: Payer: Medicaid Other | Admitting: Obstetrics & Gynecology

## 2016-07-30 ENCOUNTER — Other Ambulatory Visit (HOSPITAL_COMMUNITY)
Admission: RE | Admit: 2016-07-30 | Discharge: 2016-07-30 | Disposition: A | Discharge status: Home / Self Care | Payer: Medicaid Other | Source: Ambulatory Visit | Attending: Obstetrics and Gynecology | Admitting: Obstetrics and Gynecology

## 2016-07-30 ENCOUNTER — Encounter: Payer: Self-pay | Admitting: Obstetrics and Gynecology

## 2016-07-30 ENCOUNTER — Ambulatory Visit (INDEPENDENT_AMBULATORY_CARE_PROVIDER_SITE_OTHER): Payer: Medicaid Other | Admitting: Obstetrics and Gynecology

## 2016-07-30 VITALS — BP 160/116 | HR 105 | Temp 97.2°F | Wt 189.6 lb

## 2016-07-30 DIAGNOSIS — Z1151 Encounter for screening for human papillomavirus (HPV): Secondary | ICD-10-CM | POA: Insufficient documentation

## 2016-07-30 DIAGNOSIS — Z0000 Encounter for general adult medical examination without abnormal findings: Secondary | ICD-10-CM

## 2016-07-30 DIAGNOSIS — Z01419 Encounter for gynecological examination (general) (routine) without abnormal findings: Secondary | ICD-10-CM

## 2016-07-30 DIAGNOSIS — Z309 Encounter for contraceptive management, unspecified: Secondary | ICD-10-CM

## 2016-07-30 DIAGNOSIS — Z113 Encounter for screening for infections with a predominantly sexual mode of transmission: Secondary | ICD-10-CM | POA: Diagnosis present

## 2016-07-30 DIAGNOSIS — N939 Abnormal uterine and vaginal bleeding, unspecified: Secondary | ICD-10-CM

## 2016-07-30 MED ORDER — MEDROXYPROGESTERONE ACETATE 10 MG PO TABS: 20.0000 mg | ORAL_TABLET | Freq: Every day | ORAL | 12 refills | Status: DC

## 2016-07-30 NOTE — Progress Notes (Signed)
Subjective:     Mikayla Hansen is a 36 y.o. female G61P4 with BMI 32 and LMP 07/25/16 who is here for a comprehensive physical exam. The patient reports heavy vaginal bleeding. Patient reports a monthly period lasting 4-5 days with passage of clots on the heaviest days. Patient is sexually active using BTL for contraception. She reports some vaginal irritation which started 3 months ago. She admits to using sexual enhancement products and states that they could be the cause. She has been with the same sexual partner for the past 12 years. Patient never started BP medications by PCP.  Past Medical History:  Diagnosis Date  . Abnormal Pap smear   . Asthma   . Bacterial vaginosis 09-07-2011  . Gonorrhea 2007  . Hypertension   . Infection    Past Surgical History:  Procedure Laterality Date  . TUBAL LIGATION  2004  . WISDOM TOOTH EXTRACTION  2009   Family History  Problem Relation Age of Onset  . Hypertension Mother   . Seizures Mother   . Anesthesia problems Neg Hx     Social History   Social History  . Marital status: Single    Spouse name: N/A  . Number of children: N/A  . Years of education: N/A   Occupational History  . Not on file.   Social History Main Topics  . Smoking status: Current Every Day Smoker    Packs/day: 0.75    Types: Cigarettes  . Smokeless tobacco: Never Used  . Alcohol use 0.0 oz/week     Comment: occ  . Drug use: No  . Sexual activity: Yes    Partners: Male    Birth control/ protection: Surgical   Other Topics Concern  . Not on file   Social History Narrative   Lives with 4 children and husband (married for 17 years). 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   Health Maintenance  Topic Date Due  . PAP SMEAR  09/07/2014  . INFLUENZA VACCINE  01/17/2016  . TETANUS/TDAP  09/06/2021  . HIV Screening  Completed       Review of Systems Pertinent items are noted in HPI.   Objective:  Blood pressure (!) 160/116, pulse (!) 105,  temperature 97.2 F (36.2 C), weight 189 lb 9.6 oz (86 kg), last menstrual period 07/25/2016.     GENERAL: Well-developed, well-nourished female in no acute distress.  HEENT: Normocephalic, atraumatic. Sclerae anicteric.  NECK: Supple. Normal thyroid.  LUNGS: Clear to auscultation bilaterally.  HEART: Regular rate and rhythm. BREASTS: Symmetric in size. No palpable masses or lymphadenopathy, skin changes, or nipple drainage. ABDOMEN: Soft, nontender, nondistended. No organomegaly. PELVIC: Normal external female genitalia. Vagina is pink and rugated.  Normal discharge. Normal appearing cervix. Uterus is 12-weeks in size. No adnexal mass or tenderness. EXTREMITIES: No cyanosis, clubbing, or edema, 2+ distal pulses.    Assessment:    Healthy female exam.      Plan:    Pap smear collected STD screen done per patient request Rx provera provided to help with menorrhagia. Informed patient that she can always return in 3 months to change to Mirena IUD if good results on oral provera Pelvic ultrasound ordered Patient will be contacted with any abnormal results Advised patient to change lifestyle by quitting smoking, exercising 150 minutes weekly and modifying diet She needs to follow up with PCP for HTN management See After Visit Summary for Counseling Recommendations

## 2016-07-31 ENCOUNTER — Other Ambulatory Visit: Payer: Self-pay | Admitting: Obstetrics and Gynecology

## 2016-07-31 LAB — HEPATITIS C ANTIBODY: Hep C Virus Ab: <0.1 s/co ratio (ref 0.0–0.9)

## 2016-07-31 LAB — CERVICOVAGINAL ANCILLARY ONLY
BACTERIAL VAGINITIS: POSITIVE — AB
Candida vaginitis: POSITIVE — AB
Chlamydia: NEGATIVE
Neisseria Gonorrhea: NEGATIVE
Trichomonas: NEGATIVE

## 2016-07-31 LAB — RPR: RPR: NONREACTIVE

## 2016-07-31 LAB — HEPATITIS B SURFACE ANTIGEN: Hepatitis B Surface Ag: NEGATIVE

## 2016-07-31 LAB — HIV ANTIBODY (ROUTINE TESTING W REFLEX): HIV SCREEN 4TH GENERATION: NONREACTIVE

## 2016-07-31 MED ORDER — FLUCONAZOLE 150 MG PO TABS: 150.0000 mg | ORAL_TABLET | Freq: Once | ORAL | 0 refills | Status: AC

## 2016-07-31 MED ORDER — METRONIDAZOLE 500 MG PO TABS: 500.0000 mg | ORAL_TABLET | Freq: Two times a day (BID) | ORAL | 0 refills | Status: DC

## 2016-08-01 ENCOUNTER — Telehealth: Payer: Self-pay

## 2016-08-01 LAB — CYTOLOGY - PAP
Diagnosis: NEGATIVE
HPV (WINDOPATH): NOT DETECTED

## 2016-08-01 NOTE — Telephone Encounter (Signed)
Contacted patient and advised of lab results and rx sent per provider.

## 2016-08-08 ENCOUNTER — Ambulatory Visit (HOSPITAL_COMMUNITY): Payer: Medicaid Other

## 2016-10-18 ENCOUNTER — Telehealth: Payer: Self-pay | Admitting: *Deleted

## 2016-10-18 NOTE — Telephone Encounter (Signed)
Pt called and stated she has not had a cycle in one month after she started taking the the Progesterone pills Dr. Elly Modena prescribed and wants to know if she should come in and be seen, please advise she requested a call back.Marland KitchenMarland KitchenMarland Kitchen

## 2016-10-18 NOTE — Telephone Encounter (Signed)
Please inform the patient that the goal of the medication is to keep the lining of her uterus thin such that she may not have a period or a much lighter period than what she is used to. The medication is working as intended.  She may continue taking it to control her cycle or return for IUD insertion (as briefly discussed in our visit) which will provide the same results without having to take a daily pill  Avenell Sellers

## 2016-10-19 NOTE — Telephone Encounter (Signed)
LVM for pt to cb.

## 2016-10-19 NOTE — Telephone Encounter (Signed)
Relayed Dr. Domenic Schwab message to patient. She does not want an IUD so she will continue the pill. Pt canceled pelvic u/s but states she will r/s the appt.

## 2016-10-31 ENCOUNTER — Ambulatory Visit (HOSPITAL_COMMUNITY)
Admission: RE | Admit: 2016-10-31 | Discharge: 2016-10-31 | Disposition: A | Discharge status: Home / Self Care | Payer: Medicaid Other | Source: Ambulatory Visit | Attending: Obstetrics and Gynecology | Admitting: Obstetrics and Gynecology

## 2016-10-31 DIAGNOSIS — D259 Leiomyoma of uterus, unspecified: Secondary | ICD-10-CM | POA: Diagnosis not present

## 2016-10-31 DIAGNOSIS — N939 Abnormal uterine and vaginal bleeding, unspecified: Secondary | ICD-10-CM | POA: Insufficient documentation

## 2016-11-02 ENCOUNTER — Telehealth: Payer: Self-pay

## 2016-11-02 NOTE — Telephone Encounter (Signed)
-----   Message from Mora Bellman, MD sent at 11/01/2016 10:35 AM EDT ----- Please inform patient of normal ultrasound. Continue management of heavy vaginal bleeding with pill  Peggy

## 2016-11-02 NOTE — Telephone Encounter (Signed)
Pt aware normal u/s and continue mgmt of heavy VB with Provera. Pt agrees and has no further questions.

## 2016-11-28 IMAGING — CR DG FOOT COMPLETE 3+V*L*
3 series · 3 of 3 positions shown · non-contrast
Comparison: 01/05/2015

CLINICAL DATA: Foot pain after injury on tree stump 3 days ago.
Initial encounter.

EXAM:
LEFT FOOT - COMPLETE 3+ VIEW

[x foot lat left]
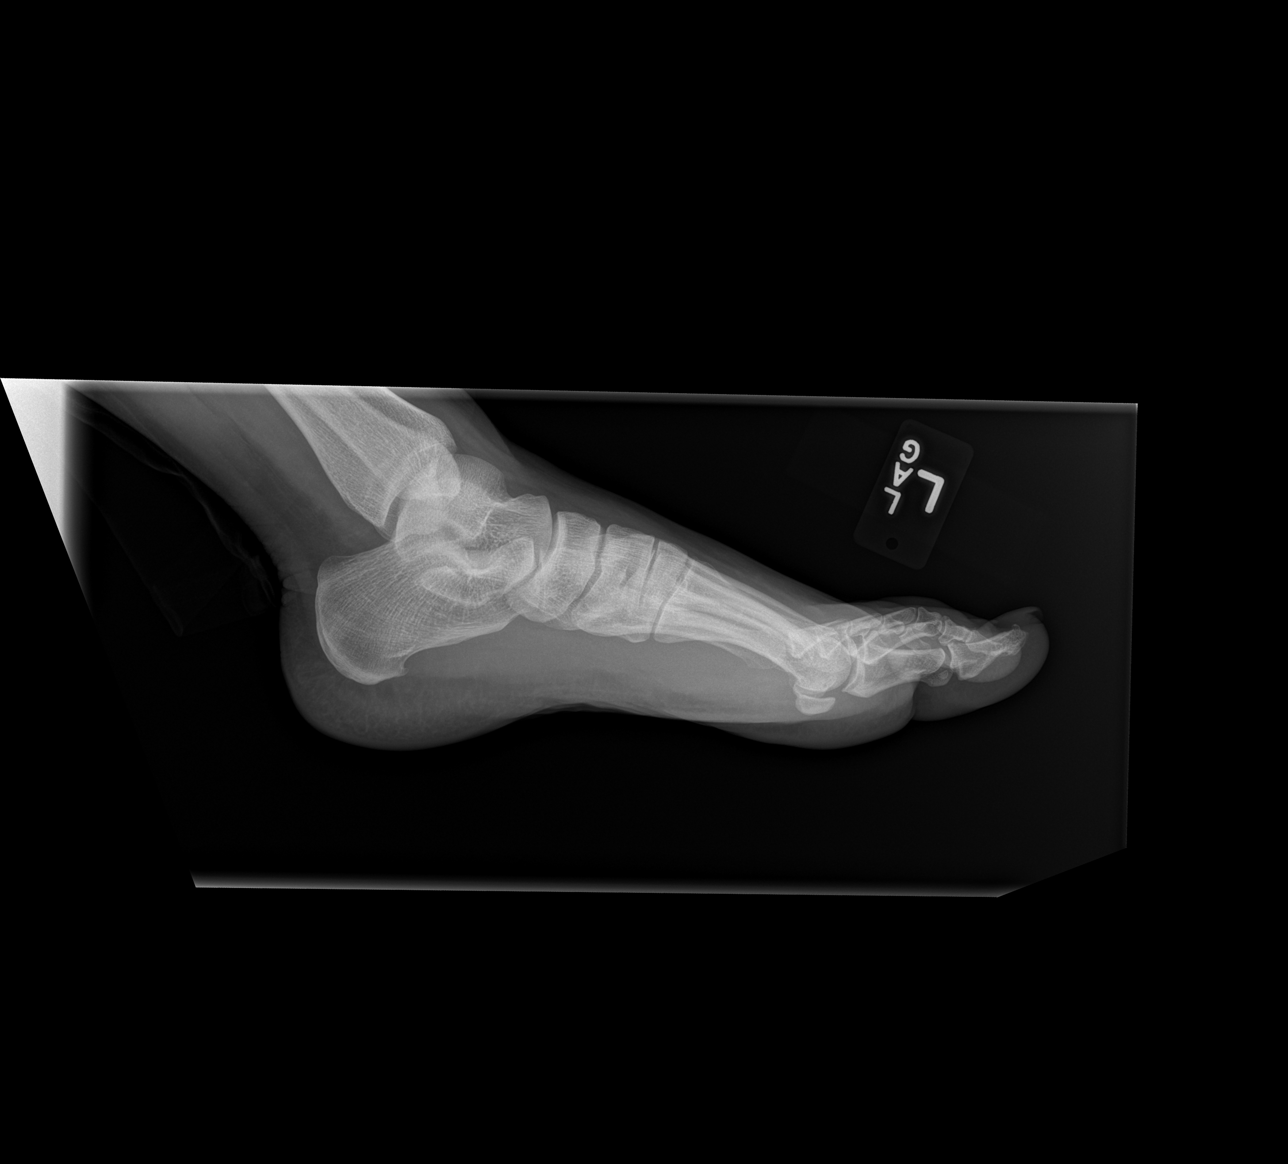

[x foot ap left]
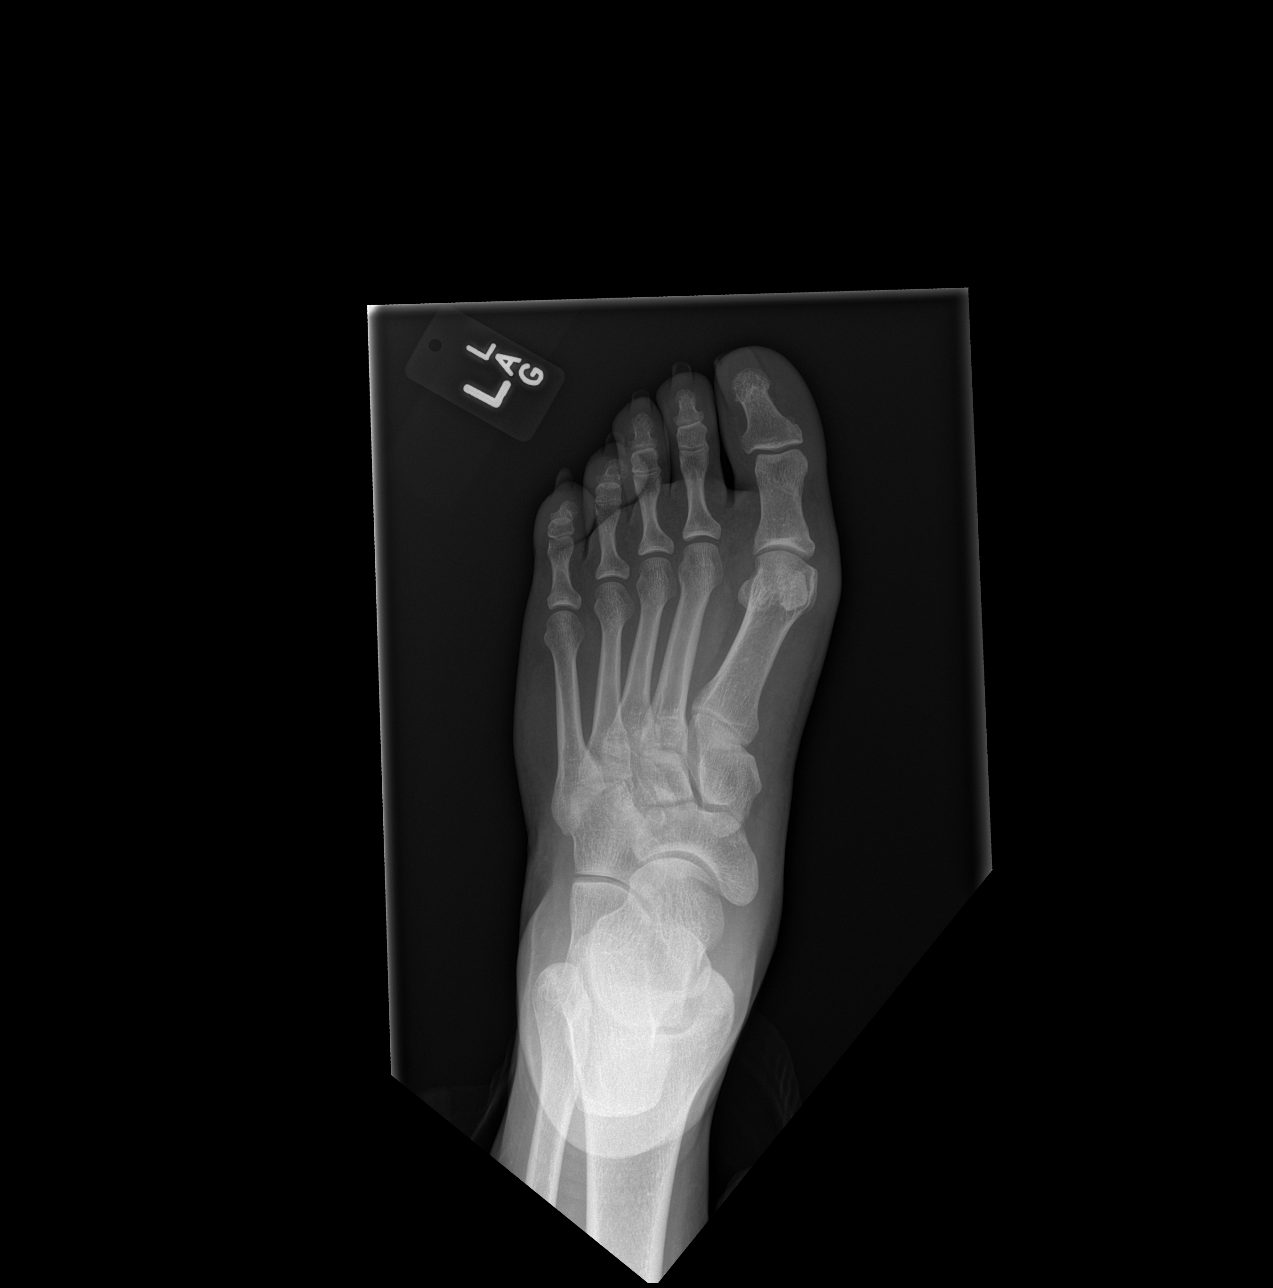

[x foot obl left]
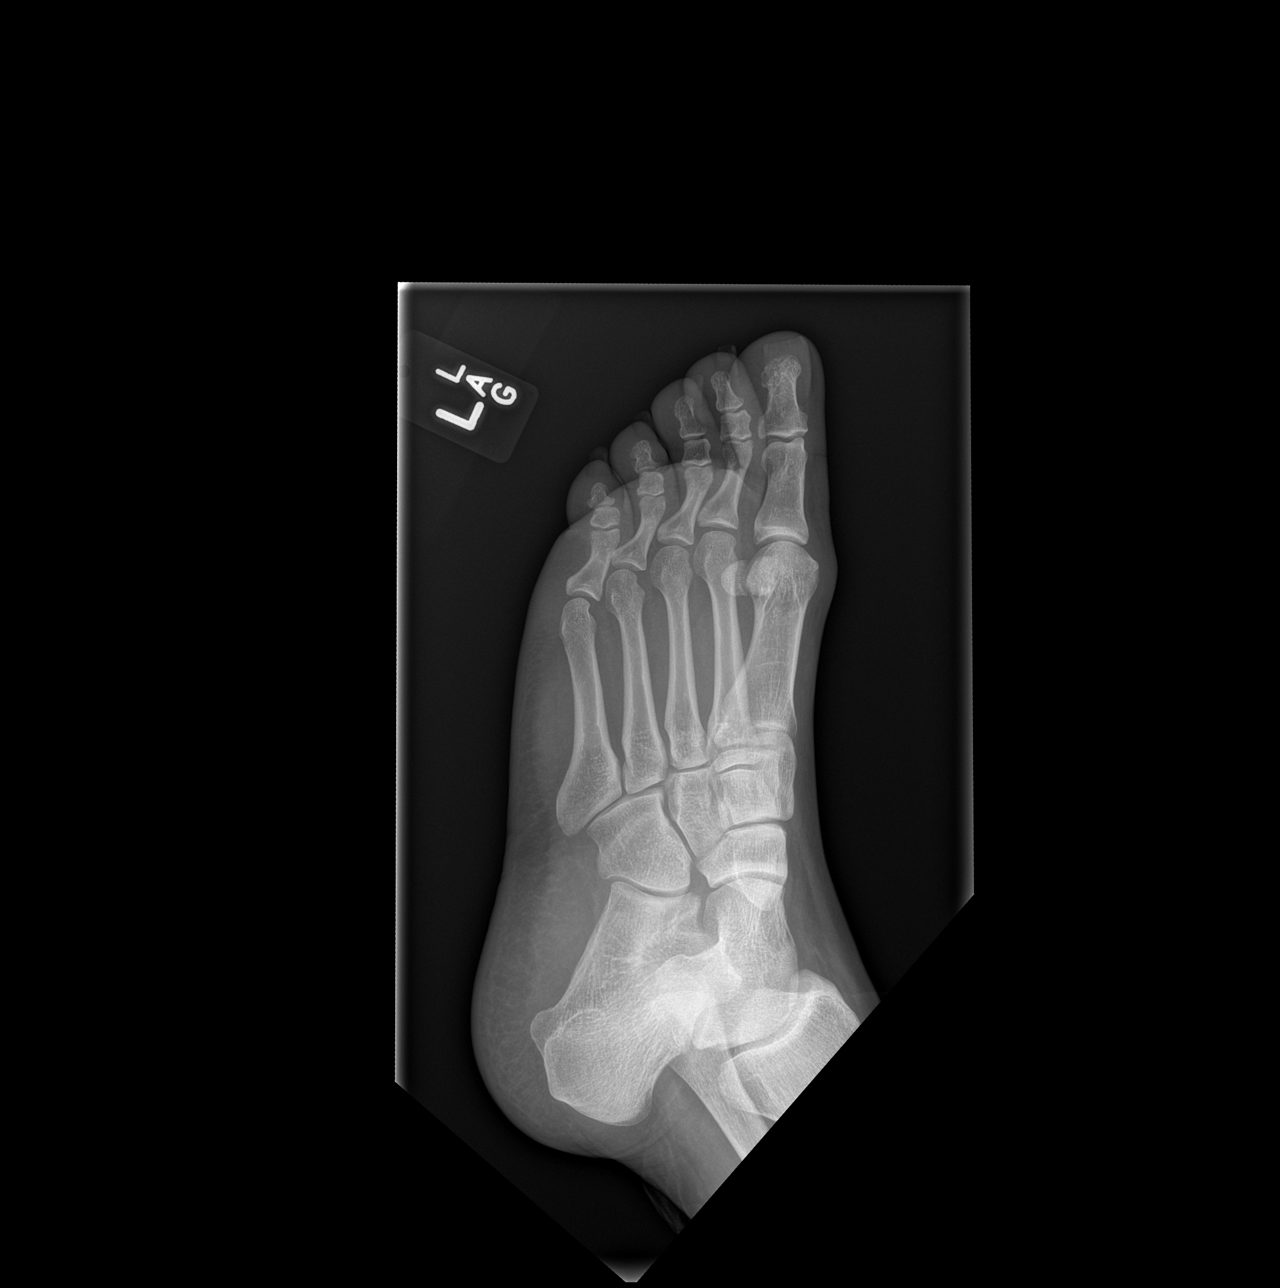

[3 of 3 positions shown; findings below may reference images not displayed]

FINDINGS: There is no evidence of fracture or dislocation.

Soft tissues are unremarkable.
IMPRESSION: Negative.

## 2016-12-28 DIAGNOSIS — D563 Thalassemia minor: Secondary | ICD-10-CM | POA: Insufficient documentation

## 2016-12-28 DIAGNOSIS — D560 Alpha thalassemia: Secondary | ICD-10-CM | POA: Insufficient documentation

## 2017-01-01 ENCOUNTER — Ambulatory Visit: Payer: Medicaid Other | Admitting: Obstetrics and Gynecology

## 2017-01-16 ENCOUNTER — Emergency Department (HOSPITAL_COMMUNITY): Payer: Medicaid Other

## 2017-01-16 ENCOUNTER — Emergency Department (HOSPITAL_COMMUNITY)
Admission: EM | Admit: 2017-01-16 | Discharge: 2017-01-16 | Disposition: A | Discharge status: Home / Self Care | Payer: Medicaid Other | Attending: Emergency Medicine | Admitting: Emergency Medicine

## 2017-01-16 ENCOUNTER — Ambulatory Visit: Payer: Medicaid Other | Admitting: Obstetrics & Gynecology

## 2017-01-16 ENCOUNTER — Encounter (HOSPITAL_COMMUNITY): Payer: Self-pay | Admitting: *Deleted

## 2017-01-16 DIAGNOSIS — Z79899 Other long term (current) drug therapy: Secondary | ICD-10-CM | POA: Insufficient documentation

## 2017-01-16 DIAGNOSIS — I10 Essential (primary) hypertension: Secondary | ICD-10-CM | POA: Insufficient documentation

## 2017-01-16 DIAGNOSIS — R519 Headache, unspecified: Secondary | ICD-10-CM

## 2017-01-16 DIAGNOSIS — F17210 Nicotine dependence, cigarettes, uncomplicated: Secondary | ICD-10-CM | POA: Insufficient documentation

## 2017-01-16 DIAGNOSIS — R51 Headache: Secondary | ICD-10-CM | POA: Insufficient documentation

## 2017-01-16 DIAGNOSIS — J45909 Unspecified asthma, uncomplicated: Secondary | ICD-10-CM | POA: Diagnosis not present

## 2017-01-16 DIAGNOSIS — R072 Precordial pain: Secondary | ICD-10-CM | POA: Diagnosis not present

## 2017-01-16 LAB — I-STAT TROPONIN, ED: TROPONIN I, POC: 0 ng/mL (ref 0.00–0.08)

## 2017-01-16 LAB — BASIC METABOLIC PANEL
Anion gap: 8 (ref 5–15)
BUN: 5 mg/dL — AB (ref 6–20)
CALCIUM: 9.1 mg/dL (ref 8.9–10.3)
CO2: 24 mmol/L (ref 22–32)
CREATININE: 0.66 mg/dL (ref 0.44–1.00)
Chloride: 105 mmol/L (ref 101–111)
GFR calc non Af Amer: >60 mL/min (ref >60)
Glucose, Bld: 114 mg/dL — ABNORMAL HIGH (ref 65–99)
Potassium: 3.7 mmol/L (ref 3.5–5.1)
SODIUM: 137 mmol/L (ref 135–145)

## 2017-01-16 LAB — CBC
HCT: 40.9 % (ref 36.0–46.0)
Hemoglobin: 13.7 g/dL (ref 12.0–15.0)
MCH: 23.6 pg — ABNORMAL LOW (ref 26.0–34.0)
MCHC: 33.5 g/dL (ref 30.0–36.0)
MCV: 70.5 fL — ABNORMAL LOW (ref 78.0–100.0)
Platelets: 307 10*3/uL (ref 150–400)
RBC: 5.8 MIL/uL — AB (ref 3.87–5.11)
RDW: 14.3 % (ref 11.5–15.5)
WBC: 7.7 10*3/uL (ref 4.0–10.5)

## 2017-01-16 MED ORDER — IBUPROFEN 400 MG PO TABS
400.0000 mg | ORAL_TABLET | Freq: Once | ORAL | Status: AC
  Administered 2017-01-16: 400 mg via ORAL
  Filled 2017-01-16: qty 1

## 2017-01-16 MED ORDER — ACETAMINOPHEN 500 MG PO TABS
1000.0000 mg | ORAL_TABLET | Freq: Once | ORAL | Status: AC
  Administered 2017-01-16: 1000 mg via ORAL
  Filled 2017-01-16: qty 2

## 2017-01-16 NOTE — ED Provider Notes (Signed)
[facility] DEPT Provider Note   CSN: [id number] Arrival date & time: 01/16/17  0801     History   Chief Complaint Chief Complaint  Patient presents with  . Headache  . Chest Pain    HPI Mikayla Hansen is a 36 y.o. female.  Patient c/o pain to mid chest onset this AM at rest. Pain dull, constant, mild, non radiating. No associated sob, nv or diaphoresis. Pain is not pleuritic. No chest wall injury or strain. No heartburn. Denies cough or uri c/o. No unusual doe or fatigue. No other recent exertional cp or discomfort. No leg pain or swelling. No hx dvt or pe.  Pt also c/o pain to head/diffuse, from front to back, presents for past couple weeks, daily - states she mentioned headache to her doctors and they told her she may need referral to neurology. Has not taken anything today for headache. Headache was milder at onset, gradual in onset.   States she did go to her eye doctor tomorrow, was told pressures were fine, and placed on moistening eye drop.    The history is provided by the patient.  Headache   Pertinent negatives include no fever, no shortness of breath and no vomiting.  Chest Pain   Associated symptoms include headaches. Pertinent negatives include no abdominal pain, no fever, no shortness of breath, no vomiting and no weakness.    Past Medical History:  Diagnosis Date  . Abnormal Pap smear   . Asthma   . Bacterial vaginosis 09-07-2011  . Gonorrhea 2007  . Hypertension   . Infection     Patient Active Problem List   Diagnosis Date Noted  . Poor high blood pressure control 05/20/2015  . Uterine leiomyoma 05/06/2015  . Vaginal candidiasis 10/09/2011  . Tobacco dependence 09/11/2011  . Bacterial vaginosis 09/07/2011  . Obesity (BMI 30-39.9) 09/07/2011  . History of asthma 09/07/2011    Past Surgical History:  Procedure Laterality Date  . TUBAL LIGATION  2004  . WISDOM TOOTH EXTRACTION  2009    OB History    Gravida Para Term Preterm AB Living   4 4 3 1  0 4   SAB TAB Ectopic Multiple Live Births   0 0 0 0        Obstetric Comments   Birth control: Tubal ligation in 2004       Home Medications    Prior to Admission medications   Medication Sig Start Date End Date Taking? Authorizing Provider  albuterol (PROVENTIL HFA) 108 (90 BASE) MCG/ACT inhaler Inhale 2 puffs into the lungs every 6 (six) hours as needed for wheezing.  06/21/14   [provider]  amLODipine (NORVASC) 5 MG tablet Take 5 mg by mouth daily.    [provider]  APAP-Pamabrom-Pyrilamine 500-25-15 MG TABS Take 2 tablets by mouth every 6 (six) hours as needed. Patient not taking: Reported on 07/30/2016 05/20/15   Kandis Cocking A, CNM  busPIRone (BUSPAR) 5 MG tablet Take 2 tablets by mouth daily as needed (anxiety).  06/23/14   [provider]  docusate sodium (COLACE) 100 MG capsule Take 1 capsule (100 mg total) by mouth 2 (two) times daily. Patient not taking: Reported on 07/30/2016 04/28/15   Morene Crocker, CNM  hydrocortisone (ANUSOL-HC) 25 MG suppository Place 1 suppository (25 mg total) rectally 2 (two) times daily. Patient not taking: Reported on 05/20/2015 04/28/15   Kandis Cocking A, CNM  ibuprofen (ADVIL,MOTRIN) 800 MG tablet Take 1 tablet (800 mg total) by  mouth every 8 (eight) hours as needed. Patient not taking: Reported on 07/30/2016 05/20/15   Morene Crocker, CNM  medroxyPROGESTERone (PROVERA) 10 MG tablet Take 2 tablets (20 mg total) by mouth daily. 07/30/16   Constant, Peggy, MD  metroNIDAZOLE (FLAGYL) 500 MG tablet Take 1 tablet (500 mg total) by mouth 2 (two) times daily. 07/31/16   Constant, Peggy, MD    Family History Family History  Problem Relation Age of Onset  . Hypertension Mother   . Seizures Mother   . Anesthesia problems Neg Hx     Social History Social History  Substance Use Topics  . Smoking status: Current Every Day Smoker    Packs/day: 0.75    Types: Cigarettes  . Smokeless tobacco: Never Used    . Alcohol use 0.0 oz/week     Comment: occ     Allergies   Shellfish allergy; Bee venom; and Hydrocodone   Review of Systems Review of Systems  Constitutional: Negative for chills and fever.  HENT: Negative for rhinorrhea, sinus pain and sore throat.   Eyes: Negative for pain and visual disturbance.  Respiratory: Negative for shortness of breath.   Cardiovascular: Positive for chest pain.  Gastrointestinal: Negative for abdominal pain and vomiting.  Endocrine: Negative for polyuria.  Genitourinary: Negative for flank pain.  Musculoskeletal: Negative for neck stiffness.  Skin: Negative for rash.  Neurological: Positive for headaches. Negative for speech difficulty and weakness.  Hematological: Does not bruise/bleed easily.  Psychiatric/Behavioral: Negative for confusion.     Physical Exam Updated Vital Signs BP (!) 159/106 (BP Location: Left Arm)   Pulse 84   Temp 99 F (37.2 C) (Oral)   Resp 18   LMP 01/16/2017   SpO2 100%   Physical Exam  Constitutional: She is oriented to person, place, and time. She appears well-developed and well-nourished. No distress.  HENT:  Head: Atraumatic.  Nose: Nose normal.  Mouth/Throat: Oropharynx is clear and moist.  No sinus or temporal tenderness.  Eyes: Pupils are equal, round, and reactive to light. Conjunctivae and EOM are normal. No scleral icterus.  Neck: Neck supple. No tracheal deviation present. No thyromegaly present.  No stiffness or rigidity.   Cardiovascular: Normal rate, regular rhythm, normal heart sounds and intact distal pulses.  Exam reveals no gallop and no friction rub.   No murmur heard. Pulmonary/Chest: Effort normal and breath sounds normal. No respiratory distress.  Abdominal: Soft. Normal appearance and bowel sounds are normal. She exhibits no distension. There is no tenderness.  Genitourinary:  Genitourinary Comments: No cva tenderness.  Musculoskeletal: Normal range of motion. She exhibits no edema or  tenderness.  Neurological: She is alert and oriented to person, place, and time. No cranial nerve deficit.  Speech clear/fluent. Motor intact bilaterally. stre 5/5. No pronator drift. Steady gait.   Skin: Skin is warm and dry. No rash noted. She is not diaphoretic.  Psychiatric: She has a normal mood and affect.  Nursing note and vitals reviewed.    ED Treatments / Results  Labs (all labs ordered are listed, but only abnormal results are displayed) Results for orders placed or performed during the hospital encounter of 40/10/27  Basic metabolic panel  Result Value Ref Range   Sodium 137 135 - 145 mmol/L   Potassium 3.7 3.5 - 5.1 mmol/L   Chloride 105 101 - 111 mmol/L   CO2 24 22 - 32 mmol/L   Glucose, Bld 114 (H) 65 - 99 mg/dL   BUN 5 (L) 6 -  20 mg/dL   Creatinine, Ser 0.66 0.44 - 1.00 mg/dL   Calcium 9.1 8.9 - 10.3 mg/dL   GFR calc non Af Amer >60 >60 mL/min   GFR calc Af Amer >60 >60 mL/min   Anion gap 8 5 - 15  CBC  Result Value Ref Range   WBC 7.7 4.0 - 10.5 K/uL   RBC 5.80 (H) 3.87 - 5.11 MIL/uL   Hemoglobin 13.7 12.0 - 15.0 g/dL   HCT 40.9 36.0 - 46.0 %   MCV 70.5 (L) 78.0 - 100.0 fL   MCH 23.6 (L) 26.0 - 34.0 pg   MCHC 33.5 30.0 - 36.0 g/dL   RDW 14.3 11.5 - 15.5 %   Platelets 307 150 - 400 K/uL  I-stat troponin, ED  Result Value Ref Range   Troponin i, poc 0.00 0.00 - 0.08 ng/mL   Comment 3           Dg Chest 2 View  Result Date: 01/16/2017 CLINICAL DATA:  Chest pain and shortness of breath with head pressures sensation beginning last night and worse today. History of asthma, current smoker, hypertension. EXAM: CHEST  2 VIEW COMPARISON:  Chest x-ray of January 05, 2015 FINDINGS: The lungs are adequately inflated and clear. The heart and mediastinal structures are normal. There is no pleural effusion or pneumothorax. The bony thorax is unremarkable. IMPRESSION: There is no active cardiopulmonary disease. Electronically Signed   By: David  Martinique M.D.   On: 01/16/2017  08:36    EKG  EKG Interpretation  Date/Time:  Wednesday January 16 2017 08:07:36 EDT Ventricular Rate:  86 PR Interval:  148 QRS Duration: 66 QT Interval:  344 QTC Calculation: 411 R Axis:   92 Text Interpretation:  Normal sinus rhythm Rightward axis No significant change since last tracing Confirmed by Lajean Saver [phone]) on 01/16/2017 9:02:50 AM       Radiology Dg Chest 2 View  Result Date: 01/16/2017 CLINICAL DATA:  Chest pain and shortness of breath with head pressures sensation beginning last night and worse today. History of asthma, current smoker, hypertension. EXAM: CHEST  2 VIEW COMPARISON:  Chest x-ray of January 05, 2015 FINDINGS: The lungs are adequately inflated and clear. The heart and mediastinal structures are normal. There is no pleural effusion or pneumothorax. The bony thorax is unremarkable. IMPRESSION: There is no active cardiopulmonary disease. Electronically Signed   By: David  Martinique M.D.   On: 01/16/2017 08:36    Procedures Procedures (including critical care time)  Medications Ordered in ED Medications  ibuprofen (ADVIL,MOTRIN) tablet 400 mg (not administered)  acetaminophen (TYLENOL) tablet 1,000 mg (not administered)     Initial Impression / Assessment and Plan / ED Course  I have reviewed the triage vital signs and the nursing notes.  Pertinent labs & imaging results that were available during my care of the patient were reviewed by me and considered in my medical decision making (see chart for details).  Iv ns.   Reviewed nursing notes and prior charts for additional history.   Acetaminophen po, ibuprofen po.  Recheck pt comfortable appearing, nad.    Trop neg.   Ct neg.  Pt currently appears stable for d/c.     Final Clinical Impressions(s) / ED Diagnoses   Final diagnoses:  None    New Prescriptions New Prescriptions   No medications on file     Lajean Saver, MD 01/16/17 1146

## 2017-01-16 NOTE — Discharge Instructions (Signed)
It was our pleasure to provide your ER care today - we hope that you feel better.  Rest. Drink plenty of fluids.  You may take excedrin migraine as need for pain.  Follow up with primary care doctor int he coming week.  Return to ER if worse, persistent or recurrent chest pain, trouble breathing, high fevers, other concern.

## 2017-01-16 NOTE — ED Triage Notes (Signed)
Pt reports HTN, having headache from back of her head to front that has been ongoing with pressure behind her eyes. Pain has been more severe since this am and reports episodes of numbness and chest pains. No acute distress is noted at triage.

## 2017-06-28 DIAGNOSIS — H4089 Other specified glaucoma: Secondary | ICD-10-CM | POA: Insufficient documentation

## 2017-07-01 ENCOUNTER — Encounter: Payer: Self-pay | Admitting: Gastroenterology

## 2017-08-07 ENCOUNTER — Ambulatory Visit: Payer: Self-pay | Admitting: Gastroenterology

## 2017-08-14 ENCOUNTER — Ambulatory Visit: Payer: Self-pay | Admitting: Physician Assistant

## 2017-08-22 ENCOUNTER — Encounter: Payer: Self-pay | Admitting: Physician Assistant

## 2017-08-26 ENCOUNTER — Ambulatory Visit: Payer: Self-pay | Admitting: Obstetrics and Gynecology

## 2017-08-28 ENCOUNTER — Other Ambulatory Visit: Payer: Self-pay

## 2017-08-28 ENCOUNTER — Ambulatory Visit (HOSPITAL_COMMUNITY)
Admission: EM | Admit: 2017-08-28 | Discharge: 2017-08-28 | Disposition: A | Discharge status: Home / Self Care | Payer: Self-pay | Attending: Family Medicine | Admitting: Family Medicine

## 2017-08-28 ENCOUNTER — Encounter (HOSPITAL_COMMUNITY): Payer: Self-pay | Admitting: Emergency Medicine

## 2017-08-28 DIAGNOSIS — L03011 Cellulitis of right finger: Secondary | ICD-10-CM

## 2017-08-28 MED ORDER — SULFAMETHOXAZOLE-TRIMETHOPRIM 800-160 MG PO TABS: 1.0000 | ORAL_TABLET | Freq: Two times a day (BID) | ORAL | 0 refills | Status: AC

## 2017-08-28 NOTE — ED Triage Notes (Signed)
Pt states she pulled a hang nail from her right middle finger about ten days ago.  Since then she is having green drainage, swelling and pain in the finger.  She denies any fever.

## 2017-08-28 NOTE — ED Provider Notes (Signed)
  [facility]   [id number] 08/28/17 Arrival Time: 1109  ASSESSMENT & PLAN:  1. Paronychia of right middle finger     Meds ordered this encounter  Medications  . sulfamethoxazole-trimethoprim (BACTRIM DS,SEPTRA DS) 800-160 MG tablet    Sig: Take 1 tablet by mouth 2 (two) times daily for 10 days.    Dispense:  20 tablet    Refill:  0   Very minimal pus expressed with simple incision to area after skin refrigerant applied. Minimal bleeding. Will f/u with PCP or here if not seeing improvement over the next 48-72 hours..  Will follow up with PCP or here if worsening or failing to improve as anticipated. Reviewed expectations re: course of current medical issues. Questions answered. Outlined signs and symptoms indicating need for more acute intervention. Patient verbalized understanding. After Visit Summary given.   SUBJECTIVE:  Mikayla Hansen is a 37 y.o. female who presents with a skin complaint.   Location: R 3rd finger at nail Onset: gradual Duration: 1 week Pruritic? No Painful? Yes Progression: was initially increasing in size but she reports some drainage about two days ago; "green pus" Denies fever. No specific aggravating or alleviating factors reported.   ROS: As per HPI.  OBJECTIVE: Vitals:   08/28/17 1228  BP: 129/89  Pulse: 86  Temp: 98.1 F (36.7 C)  TempSrc: Oral  SpO2: 99%    General appearance: alert; no distress Lungs: clear to auscultation bilaterally Heart: regular rate and rhythm Extremities: no edema Skin: warm and dry; R 3rd finger with small paronychia; tender Psychological: alert and cooperative; normal mood and affect  Allergies  Allergen Reactions  . Shellfish Allergy Anaphylaxis  . Bee Venom Hives and Swelling  . Hydrocodone Palpitations    Past Medical History:  Diagnosis Date  . Abnormal Pap smear   . Asthma   . Bacterial vaginosis 09-07-2011  . Gonorrhea 2007  . Hypertension   . Infection    Social  History   Socioeconomic History  . Marital status: Single    Spouse name: Not on file  . Number of children: Not on file  . Years of education: Not on file  . Highest education level: Not on file  Social Needs  . Financial resource strain: Not on file  . Food insecurity - worry: Not on file  . Food insecurity - inability: Not on file  . Transportation needs - medical: Not on file  . Transportation needs - non-medical: Not on file  Occupational History  . Not on file  Tobacco Use  . Smoking status: Current Every Day Smoker    Packs/day: 0.75    Types: Cigarettes  . Smokeless tobacco: Never Used  Substance and Sexual Activity  . Alcohol use: Yes    Alcohol/week: 0.0 oz    Comment: occ  . Drug use: No  . Sexual activity: Yes    Partners: Male    Birth control/protection: Surgical  Other Topics Concern  . Not on file  Social History Narrative   Lives with 4 children and husband (married for 17 years). 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   Family History  Problem Relation Age of Onset  . Hypertension Mother   . Seizures Mother   . Anesthesia problems Neg Hx    Past Surgical History:  Procedure Laterality Date  . TUBAL LIGATION  2004  . WISDOM TOOTH EXTRACTION  2009     Vanessa Kick, MD 08/31/17 1157

## 2017-08-28 NOTE — Discharge Instructions (Signed)
Please follow up with your doctor or here if you are not seeing improvement within the next 2-3 days. You may use over the counter ibuprofen or acetaminophen as needed.

## 2017-09-05 ENCOUNTER — Ambulatory Visit: Payer: Self-pay | Admitting: Physician Assistant

## 2017-09-23 ENCOUNTER — Telehealth: Payer: Self-pay | Admitting: *Deleted

## 2017-09-23 NOTE — Telephone Encounter (Signed)
Lm for the patient asking her to please call me, Hudson Majkowski P.  I cannot locate the paperwork we had previously received.  Her appointment with Amy was rescheduled 2-3 times and she did not show one of those times.  Wanted to ask if she could have the paperwork faxed to [place] again.

## 2017-09-25 NOTE — Telephone Encounter (Signed)
LM for the patient again today 09-25-2017 to please call me , I advised we need the paperwork faxed to [place] again.  I cannot locate it. She was scheduled several different times and she did not show for one appointment.

## 2017-09-26 NOTE — Telephone Encounter (Signed)
The patient returned my call and she is going to fax to me the records she has with her medical history to our fax # [phone]. She will fax it to me , I advised her to put Mikayla Hansen P. On the fax since we have 2 Lundy Cozart's here now.  I asked if she had any GI history and she said she does not.  Her appointment with Nicoletta Ba PA  is 10-01-2017 at 8:30 AM.

## 2017-10-01 ENCOUNTER — Ambulatory Visit: Payer: Self-pay | Admitting: Physician Assistant

## 2017-10-17 ENCOUNTER — Ambulatory Visit: Payer: Self-pay | Admitting: Physician Assistant

## 2017-10-25 ENCOUNTER — Ambulatory Visit: Payer: Self-pay | Admitting: Physician Assistant

## 2018-04-07 ENCOUNTER — Ambulatory Visit: Payer: Self-pay | Admitting: Obstetrics and Gynecology

## 2018-04-21 ENCOUNTER — Encounter: Payer: Self-pay | Admitting: Obstetrics and Gynecology

## 2018-04-21 ENCOUNTER — Ambulatory Visit (INDEPENDENT_AMBULATORY_CARE_PROVIDER_SITE_OTHER): Payer: PRIVATE HEALTH INSURANCE | Admitting: Obstetrics and Gynecology

## 2018-04-21 VITALS — BP 152/96 | HR 85 | Wt 191.0 lb

## 2018-04-21 DIAGNOSIS — R102 Pelvic and perineal pain: Secondary | ICD-10-CM

## 2018-04-21 NOTE — Progress Notes (Addendum)
37 yo G4P4 here for the evaluation of chronic pelvic pain which has been present for the past 4 years. Patient describes a Mikayla Hansen right lower quadrant pain which is worst with her periods, coughing/sneezing/laughing and intercourse. Patient states nothing seems to alleviate the pain. She reports a monthly periods of 7 days and regular bowel movements. She reports a normal vaginal swab 3 weeks ago with her PCP and is scheduled for pelvic ultrasound on 11/6 (ordered by PCP)  Past Medical History:  Diagnosis Date  . Abnormal Pap smear   . Asthma   . Bacterial vaginosis 09-07-2011  . Gonorrhea 2007  . Hypertension   . Infection    Past Surgical History:  Procedure Laterality Date  . TUBAL LIGATION  2004  . WISDOM TOOTH EXTRACTION  2009   Family History  Problem Relation Age of Onset  . Hypertension Mother   . Seizures Mother   . Anesthesia problems Neg Hx    Social History   Tobacco Use  . Smoking status: Current Every Day Smoker    Packs/day: 0.75    Types: Cigarettes  . Smokeless tobacco: Never Used  Substance Use Topics  . Alcohol use: Yes    Alcohol/week: 0.0 standard drinks    Comment: occ  . Drug use: No   ROS See pertinent in HPI  Blood pressure (!) 152/96, pulse 85, weight 191 lb (86.6 kg), last menstrual period 04/18/2018. GENERAL: Well-developed, well-nourished female in no acute distress.  LUNGS: Clear to auscultation bilaterally.  HEART: Regular rate and rhythm. ABDOMEN: Soft, nondistended. Mild tenderness in RLQ near superior iliac spine PELVIC: patient declined as she is on her cycle EXTREMITIES: No cyanosis, clubbing, or edema, 2+ distal pulses.  A/P 37 yo here for evaluation of chronic pelvic pain - Will follow up on pelvic ultrasound - if ultrasound is normal, possible etiology of a hernia should be evaluated - Patient had a normal cervical pap smear 07/2016 - RTC prn

## 2018-12-13 ENCOUNTER — Encounter (HOSPITAL_COMMUNITY): Payer: Self-pay | Admitting: Physician Assistant

## 2018-12-13 ENCOUNTER — Ambulatory Visit (HOSPITAL_COMMUNITY)
Admission: EM | Admit: 2018-12-13 | Discharge: 2018-12-13 | Disposition: A | Discharge status: Home / Self Care | Payer: Medicaid Other | Attending: Physician Assistant | Admitting: Physician Assistant

## 2018-12-13 ENCOUNTER — Other Ambulatory Visit: Payer: Self-pay

## 2018-12-13 DIAGNOSIS — L03011 Cellulitis of right finger: Secondary | ICD-10-CM | POA: Diagnosis not present

## 2018-12-13 MED ORDER — CEPHALEXIN 500 MG PO CAPS: 500.0000 mg | ORAL_CAPSULE | Freq: Four times a day (QID) | ORAL | 0 refills | Status: DC

## 2018-12-13 NOTE — Discharge Instructions (Signed)
Start keflex as directed. You can remove current dressing in 24 hours. Keep wound clean and dry. You can clean gently with soap and water. Do not soak area in water. Monitor for spreading redness, increased warmth, increased swelling, fever, follow up for reevaluation needed. 

## 2018-12-13 NOTE — ED Triage Notes (Signed)
Provider triage  

## 2018-12-13 NOTE — ED Provider Notes (Signed)
[place]    CSN: [id number] Arrival date & time: 12/13/18  1719      History   Chief Complaint Chief Complaint  Patient presents with  . Hand Pain    HPI Mikayla Hansen is a 38 y.o. female.   38 year old female comes in for few day history of right ring finger swelling, erythema to the surrounding nailbed.  Denies injury/trauma.  States recently removed fake nail, and unsure if that caused it.  Has had increasing swelling and erythema.  Denies fever, chills, night sweats.  States with certain movement, pain can radiate up the arm.  Has not tried anything for symptoms.     Past Medical History:  Diagnosis Date  . Abnormal Pap smear   . Asthma   . Bacterial vaginosis 09-07-2011  . Gonorrhea 2007  . Hypertension   . Infection     Patient Active Problem List   Diagnosis Date Noted  . Other specified glaucoma 06/28/2017  . 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 Poor high blood pressure control 05/20/2015  . Uterine leiomyoma 05/06/2015  . Anxiety 04/29/2014  . Chest pain 04/29/2014  . Essential hypertension 04/29/2014  . Anal itching 02/18/2014  . Asthma 11/04/2013  . Vaginal candidiasis 10/09/2011  . Tobacco dependence 09/11/2011  . Bacterial vaginosis 09/07/2011  . Obesity (BMI 30-39.9) 09/07/2011  . History of asthma 09/07/2011    Past Surgical History:  Procedure Laterality Date  . TUBAL LIGATION  2004  . WISDOM TOOTH EXTRACTION  2009    OB History    Gravida  4   Para  4   Term  3   Preterm  1   AB  0   Living  4     SAB  0   TAB  0   Ectopic  0   Multiple  0   Live Births           Obstetric Comments  Birth control: Tubal ligation in 2004         Home Medications    Prior to Admission medications   Medication Sig Start Date End Date Taking? Authorizing Provider  albuterol (PROVENTIL HFA) 108 (90 BASE) MCG/ACT inhaler Inhale 2 puffs into the lungs every 6 (six) hours as needed for wheezing.  06/21/14   [provider]  amLODipine (NORVASC) 5 MG tablet Take 10 mg by mouth daily.     [provider]  cephALEXin (KEFLEX) 500 MG capsule Take 1 capsule (500 mg total) by mouth 4 (four) times daily. 12/13/18   Tasia Catchings, Vieno Tarrant V, PA-C  HYDROCHLOROTHIAZIDE PO Take 5 mg by mouth daily.    [provider]  naproxen sodium (ANAPROX) 220 MG tablet Take 220 mg by mouth 2 (two) times daily as needed (pain).    [provider]  Polyethyl Glycol-Propyl Glycol (SYSTANE OP) Place 1 drop into both eyes daily.    [provider]  Travoprost, BAK Free, (TRAVATAN) 0.004 % SOLN ophthalmic solution Place 1 drop into both eyes daily as needed (dry eyes).    [provider]    Family History Family History  Problem Relation Age of Onset  . Hypertension Mother   . Seizures Mother   . Anesthesia problems Neg Hx     Social History Social History   Tobacco Use  . Smoking status: Current Every Day Smoker    Packs/day: 0.75    Types: Cigarettes  . Smokeless tobacco: Never Used  Substance Use Topics  .  Alcohol use: Yes    Alcohol/week: 0.0 standard drinks    Comment: occ  . Drug use: No     Allergies   Shellfish allergy, Bee venom, and Hydrocodone   Review of Systems Review of Systems  Reason unable to perform ROS: See HPI as above.     Physical Exam Triage Vital Signs ED Triage Vitals  Enc Vitals Group     BP 12/13/18 1835 (!) 159/102     Pulse Rate 12/13/18 1835 97     Resp 12/13/18 1848 18     Temp 12/13/18 1835 98.4 F (36.9 C)     Temp Source 12/13/18 1835 Oral     SpO2 12/13/18 1835 100 %     Weight --      Height --      Head Circumference --      Peak Flow --      Pain Score 12/13/18 1848 0     Pain Loc --      Pain Edu? --      Excl. in [place]? --    No data found.  Updated Vital Signs BP (!) 159/102 (BP Location: Left Arm)   Pulse 97   Temp 98.4 F (36.9 C) (Oral)   Resp 18   SpO2 100%   Visual Acuity Right Eye Distance:   Left Eye  Distance:   Bilateral Distance:    Right Eye Near:   Left Eye Near:    Bilateral Near:     Physical Exam Constitutional:      General: She is not in acute distress.    Appearance: She is well-developed. She is not diaphoretic.  HENT:     Head: Normocephalic and atraumatic.  Eyes:     Conjunctiva/sclera: Conjunctivae normal.     Pupils: Pupils are equal, round, and reactive to light.  Musculoskeletal:     Comments: Paronychia to the right ring finger nailbed.  Surrounding cellulitis.  Full range of motion of fingers.  Sensation intact.  Cap refill less than 2 seconds.  Neurological:     Mental Status: She is alert and oriented to person, place, and time.      UC Treatments / Results  Labs (all labs ordered are listed, but only abnormal results are displayed) Labs Reviewed - No data to display  EKG None  Radiology No results found.  Procedures Incision and Drainage  Date/Time: 12/13/2018 7:22 PM Performed by: Ok Edwards, PA-C Authorized by: Ok Edwards, PA-C   Consent:    Consent obtained:  Verbal   Consent given by:  Patient   Risks discussed:  Bleeding, incomplete drainage, pain, infection and damage to other organs   Alternatives discussed:  Alternative treatment and referral Location:    Type:  Abscess   Size:  1cm x 0.5cm   Location:  Upper extremity   Upper extremity location:  Finger   Finger location:  R ring finger Pre-procedure details:    Skin preparation:  Hibiclens Anesthesia (see MAR for exact dosages):    Anesthesia method:  Nerve block   Block needle gauge:  27 G   Block anesthetic:  Lidocaine 2% w/o epi   Block injection procedure:  Anatomic landmarks identified, introduced needle, incremental injection, negative aspiration for blood and anatomic landmarks palpated   Block outcome:  Anesthesia achieved Procedure type:    Complexity:  Simple Procedure details:    Incision types:  Single straight   Incision depth:  Dermal   Scalpel blade:  11    Wound management:  Irrigated with saline   Drainage:  Purulent   Drainage amount:  Moderate   Wound treatment:  Wound left open   Packing materials:  None Post-procedure details:    Patient tolerance of procedure:  Tolerated well, no immediate complications   (including critical care time)  Medications Ordered in UC Medications - No data to display  Initial Impression / Assessment and Plan / UC Course  I have reviewed the triage vital signs and the nursing notes.  Pertinent labs & imaging results that were available during my care of the patient were reviewed by me and considered in my medical decision making (see chart for details).    Patient requesting digital block for drainage.  Digital block performed, anesthesia achieved, will continue with I&D.  Patient tolerated procedure well. Start keflex for surrounding cellulitis. Wound care instructions given. Return precautions given. Patient expresses understanding and agrees to plan.   Final Clinical Impressions(s) / UC Diagnoses   Final diagnoses:  Paronychia of finger, right   ED Prescriptions    Medication Sig Dispense Auth. Provider   cephALEXin (KEFLEX) 500 MG capsule Take 1 capsule (500 mg total) by mouth 4 (four) times daily. 28 capsule Tobin Chad, [place] 12/13/18 1924

## 2019-03-10 ENCOUNTER — Encounter (HOSPITAL_COMMUNITY): Payer: Self-pay | Admitting: Emergency Medicine

## 2019-03-10 ENCOUNTER — Other Ambulatory Visit: Payer: Self-pay

## 2019-03-10 ENCOUNTER — Ambulatory Visit (HOSPITAL_COMMUNITY)
Admission: EM | Admit: 2019-03-10 | Discharge: 2019-03-10 | Disposition: A | Discharge status: Home / Self Care | Payer: Medicaid Other | Attending: Family Medicine | Admitting: Family Medicine

## 2019-03-10 ENCOUNTER — Emergency Department (HOSPITAL_COMMUNITY)
Admission: EM | Admit: 2019-03-10 | Discharge: 2019-03-10 | Discharge status: Against Medical Advice | Payer: Medicaid Other | Attending: Emergency Medicine | Admitting: Emergency Medicine

## 2019-03-10 DIAGNOSIS — R42 Dizziness and giddiness: Secondary | ICD-10-CM

## 2019-03-10 DIAGNOSIS — Z5321 Procedure and treatment not carried out due to patient leaving prior to being seen by health care provider: Secondary | ICD-10-CM | POA: Diagnosis not present

## 2019-03-10 MED ORDER — MECLIZINE HCL 25 MG PO TABS: 25.0000 mg | ORAL_TABLET | Freq: Three times a day (TID) | ORAL | 0 refills | Status: DC | PRN

## 2019-03-10 MED ORDER — FLUTICASONE PROPIONATE 50 MCG/ACT NA SUSP: 1.0000 | Freq: Every day | NASAL | 2 refills | Status: DC

## 2019-03-10 MED ORDER — AMLODIPINE BESYLATE 5 MG PO TABS: 10.0000 mg | ORAL_TABLET | Freq: Every day | ORAL | 1 refills | Status: DC

## 2019-03-10 NOTE — ED Triage Notes (Signed)
Patient is dizzy, patient woke during the night to go to bathroom, noticed then how dizzy she was .    Patient went to [place], was told a 7 hour wait and went back home.  Patient then called her provider who recommended coming to [facility].    Left ear aching and "draining" for 2 days.

## 2019-03-10 NOTE — ED Notes (Signed)
Patient states that she will be seen by her doctor in the am

## 2019-03-10 NOTE — ED Provider Notes (Signed)
[place]    CSN: [id number] Arrival date & time: 03/10/19  [id number]      History   Chief Complaint Chief Complaint  Patient presents with  . Dizziness    HPI Mikayla Hansen is a 38 y.o. female.   Patient is a 38 year old female past medical history of asthma, BV, gonorrhea, hypertension, thalassemia.  Patient here for vertigo type symptoms.  Reporting dizziness upon waking around 130 this morning.  Was unable to walk and had to crawl due to the dizziness.  Symptoms have been constant.  Denies any headache, nausea or vomiting.  She is also had associated left ear discomfort aching and draining for 2 days.  Has sensation that the room is spinning.  She has also not had her blood pressure medicine in approximately 2 months.  Blood pressure is 148/92 today.  No chest pain, SOB. Has been eating and drinking normally.   ROS per HPI      Past Medical History:  Diagnosis Date  . Abnormal Pap smear   . Asthma   . Bacterial vaginosis 09-07-2011  . Gonorrhea 2007  . Hypertension   . Infection     Patient Active Problem List   Diagnosis Date Noted  . Other specified glaucoma 06/28/2017  . 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 Poor high blood pressure control 05/20/2015  . Uterine leiomyoma 05/06/2015  . Anxiety 04/29/2014  . Chest pain 04/29/2014  . Essential hypertension 04/29/2014  . Anal itching 02/18/2014  . Asthma 11/04/2013  . Vaginal candidiasis 10/09/2011  . Tobacco dependence 09/11/2011  . Bacterial vaginosis 09/07/2011  . Obesity (BMI 30-39.9) 09/07/2011  . History of asthma 09/07/2011    Past Surgical History:  Procedure Laterality Date  . TUBAL LIGATION  2004  . WISDOM TOOTH EXTRACTION  2009    OB History    Gravida  4   Para  4   Term  3   Preterm  1   AB  0   Living  4     SAB  0   TAB  0   Ectopic  0   Multiple  0   Live Births           Obstetric Comments  Birth control: Tubal ligation in 2004         Home  Medications    Prior to Admission medications   Medication Sig Start Date End Date Taking? Authorizing Provider  albuterol (PROVENTIL HFA) 108 (90 BASE) MCG/ACT inhaler Inhale 2 puffs into the lungs every 6 (six) hours as needed for wheezing.  06/21/14  Yes [provider]  amLODipine (NORVASC) 5 MG tablet Take 2 tablets (10 mg total) by mouth daily. 03/10/19   Greg Cratty, Tressia Miners A, NP  fluticasone (FLONASE) 50 MCG/ACT nasal spray Place 1 spray into both nostrils daily. 03/10/19   Kyzer Blowe, Tressia Miners A, NP  HYDROCHLOROTHIAZIDE PO Take 5 mg by mouth daily.    [provider]  meclizine (ANTIVERT) 25 MG tablet Take 1 tablet (25 mg total) by mouth 3 (three) times daily as needed for dizziness. 03/10/19   Orvan July, NP    Family History Family History  Problem Relation Age of Onset  . Hypertension Mother   . Seizures Mother   . Anesthesia problems Neg Hx     Social History Social History   Tobacco Use  . Smoking status: Current Every Day Smoker    Packs/day: 0.75    Types: Cigarettes  . Smokeless tobacco: Never  Used  Substance Use Topics  . Alcohol use: Yes    Alcohol/week: 0.0 standard drinks    Comment: occ  . Drug use: No     Allergies   Shellfish allergy, Bee venom, and Hydrocodone   Review of Systems Review of Systems   Physical Exam Triage Vital Signs ED Triage Vitals  Enc Vitals Group     BP 03/10/19 1026 (!) 148/92     Pulse Rate 03/10/19 1026 72     Resp 03/10/19 1026 20     Temp 03/10/19 1026 98.5 F (36.9 C)     Temp Source 03/10/19 1026 Oral     SpO2 03/10/19 1026 99 %     Weight --      Height --      Head Circumference --      Peak Flow --      Pain Score 03/10/19 1022 2     Pain Loc --      Pain Edu? --      Excl. in [place]? --    No data found.  Updated Vital Signs BP (!) 148/92 (BP Location: Right Arm) Comment: hasnt had medication in 2 months  Pulse 72   Temp 98.5 F (36.9 C) (Oral)   Resp 20   LMP 02/16/2019   SpO2 99%   Visual  Acuity Right Eye Distance:   Left Eye Distance:   Bilateral Distance:    Right Eye Near:   Left Eye Near:    Bilateral Near:     Physical Exam Vitals signs and nursing note reviewed.  Constitutional:      Appearance: She is ill-appearing.  HENT:     Head: Normocephalic and atraumatic.     Right Ear: Tympanic membrane normal.     Left Ear: Tympanic membrane normal.     Nose: Nose normal.  Eyes:     Extraocular Movements: Extraocular movements intact.     Conjunctiva/sclera: Conjunctivae normal.     Pupils: Pupils are equal, round, and reactive to light.  Neck:     Musculoskeletal: Normal range of motion.  Cardiovascular:     Rate and Rhythm: Normal rate and regular rhythm.  Pulmonary:     Effort: Pulmonary effort is normal.     Breath sounds: Normal breath sounds.  Musculoskeletal:     Comments: In a wheelchair unable to stand due to dizziness  Skin:    General: Skin is warm and dry.  Neurological:     General: No focal deficit present.     Mental Status: She is alert.  Psychiatric:        Mood and Affect: Mood normal.      UC Treatments / Results  Labs (all labs ordered are listed, but only abnormal results are displayed) Labs Reviewed - No data to display  EKG   Radiology No results found.  Procedures Procedures (including critical care time)  Medications Ordered in UC Medications - No data to display  Initial Impression / Assessment and Plan / UC Course  I have reviewed the triage vital signs and the nursing notes.  Pertinent labs & imaging results that were available during my care of the patient were reviewed by me and considered in my medical decision making (see chart for details).     Vertigo- symptoms and exam consistent with this.  Will give meclizine for symptoms.  No acute neuro deficits.  No concern for CVA at this time.  Information given on Epleys  maneuver.  Hydrate.  Strict return [recautions.  Refilled her BP meds.  Final  Clinical Impressions(s) / UC Diagnoses   Final diagnoses:  Vertigo     Discharge Instructions     Treating you for vertigo Take the meclizine as prescribed, this will help with dizziness and nausea. Flonase nasal spray daily Information given on your discharge instructions for the Epley's maneuver you can try this at home sometimes this helps with vertigo. There is some more information about precautions to take with vertigo.  Make sure you are staying hydrated Also refilled your blood pressure medication Follow up as needed for continued or worsening symptoms     ED Prescriptions    Medication Sig Dispense Auth. Provider   amLODipine (NORVASC) 5 MG tablet Take 2 tablets (10 mg total) by mouth daily. 30 tablet Rushil Kimbrell A, NP   meclizine (ANTIVERT) 25 MG tablet Take 1 tablet (25 mg total) by mouth 3 (three) times daily as needed for dizziness. 30 tablet Aviyon Hocevar A, NP   fluticasone (FLONASE) 50 MCG/ACT nasal spray Place 1 spray into both nostrils daily. 16 g Loura Halt A, NP     PDMP not reviewed this encounter.   Orvan July, NP 03/10/19 1448

## 2019-03-10 NOTE — Discharge Instructions (Signed)
Treating you for vertigo Take the meclizine as prescribed, this will help with dizziness and nausea. Flonase nasal spray daily Information given on your discharge instructions for the Epley's maneuver you can try this at home sometimes this helps with vertigo. There is some more information about precautions to take with vertigo.  Make sure you are staying hydrated Also refilled your blood pressure medication Follow up as needed for continued or worsening symptoms

## 2019-04-06 ENCOUNTER — Encounter (HOSPITAL_COMMUNITY): Payer: Self-pay

## 2019-04-06 ENCOUNTER — Other Ambulatory Visit: Payer: Self-pay

## 2019-04-06 ENCOUNTER — Ambulatory Visit (HOSPITAL_COMMUNITY)
Admission: EM | Admit: 2019-04-06 | Discharge: 2019-04-06 | Disposition: A | Discharge status: Home / Self Care | Payer: Medicaid Other | Attending: Emergency Medicine | Admitting: Emergency Medicine

## 2019-04-06 DIAGNOSIS — T148XXA Other injury of unspecified body region, initial encounter: Secondary | ICD-10-CM | POA: Diagnosis not present

## 2019-04-06 DIAGNOSIS — W57XXXA Bitten or stung by nonvenomous insect and other nonvenomous arthropods, initial encounter: Secondary | ICD-10-CM

## 2019-04-06 MED ORDER — CEPHALEXIN 500 MG PO CAPS: 500.0000 mg | ORAL_CAPSULE | Freq: Three times a day (TID) | ORAL | 0 refills | Status: DC

## 2019-04-06 MED ORDER — PREDNISONE 10 MG (21) PO TBPK: ORAL_TABLET | Freq: Every day | ORAL | 0 refills | Status: DC

## 2019-04-06 NOTE — ED Triage Notes (Signed)
Patient presents to Urgent Care with complaints of itchy swollen insect bites since yesterday when she woke up. Patient reports she thinks it was a spider, bit her on the face, neck, and right hand.

## 2019-04-06 NOTE — ED Provider Notes (Signed)
[place]    CSN: [id number] Arrival date & time: 04/06/19  1556      History   Chief Complaint Chief Complaint  Patient presents with  . Appointment    4:10  . Insect Bite    HPI Mikayla Hansen is a 38 y.o. female.   Patient presents with multiple insect bites on her right hand, neck, and the right side of her face which occurred overnight.  She states the insect bites are pruritic and tender to touch.  No drainage.  She denies fever, chills, difficulty swallowing, shortness of breath, or other symptoms.  No treatments attempted at home.  LMP: current.   The history is provided by the patient.    Past Medical History:  Diagnosis Date  . Abnormal Pap smear   . Asthma   . Bacterial vaginosis 09-07-2011  . Gonorrhea 2007  . Hypertension   . Infection     Patient Active Problem List   Diagnosis Date Noted  . Other specified glaucoma 06/28/2017  . 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 Poor high blood pressure control 05/20/2015  . Uterine leiomyoma 05/06/2015  . Anxiety 04/29/2014  . Chest pain 04/29/2014  . Essential hypertension 04/29/2014  . Anal itching 02/18/2014  . Asthma 11/04/2013  . Vaginal candidiasis 10/09/2011  . Tobacco dependence 09/11/2011  . Bacterial vaginosis 09/07/2011  . Obesity (BMI 30-39.9) 09/07/2011  . History of asthma 09/07/2011    Past Surgical History:  Procedure Laterality Date  . TUBAL LIGATION  2004  . WISDOM TOOTH EXTRACTION  2009    OB History    Gravida  4   Para  4   Term  3   Preterm  1   AB  0   Living  4     SAB  0   TAB  0   Ectopic  0   Multiple  0   Live Births           Obstetric Comments  Birth control: Tubal ligation in 2004         Home Medications    Prior to Admission medications   Medication Sig Start Date End Date Taking? Authorizing Provider  albuterol (PROVENTIL HFA) 108 (90 BASE) MCG/ACT inhaler Inhale 2 puffs into the lungs every 6 (six) hours as needed for  wheezing.  06/21/14   [provider]  amLODipine (NORVASC) 5 MG tablet Take 2 tablets (10 mg total) by mouth daily. 03/10/19   Loura Halt A, NP  cephALEXin (KEFLEX) 500 MG capsule Take 1 capsule (500 mg total) by mouth 3 (three) times daily. 04/06/19   Sharion Balloon, NP  fluticasone (FLONASE) 50 MCG/ACT nasal spray Place 1 spray into both nostrils daily. 03/10/19   Bast, Tressia Miners A, NP  HYDROCHLOROTHIAZIDE PO Take 5 mg by mouth daily.    [provider]  meclizine (ANTIVERT) 25 MG tablet Take 1 tablet (25 mg total) by mouth 3 (three) times daily as needed for dizziness. 03/10/19   Bast, Tressia Miners A, NP  predniSONE (STERAPRED UNI-PAK 21 TAB) 10 MG (21) TBPK tablet Take by mouth daily. Take 6 tabs by mouth daily  for 1 day, then 5 tabs for 1 day, then 4 tabs for 1 day, then 3 tabs for 1 day, 2 tabs for 1 day, then 1 tab by mouth daily for 1 day 04/06/19   Sharion Balloon, NP    Family History Family History  Problem Relation Age of Onset  . Hypertension  Mother   . Seizures Mother   . Anesthesia problems Neg Hx     Social History Social History   Tobacco Use  . Smoking status: Current Every Day Smoker    Packs/day: 1.00    Types: Cigarettes  . Smokeless tobacco: Never Used  Substance Use Topics  . Alcohol use: Yes    Alcohol/week: 0.0 standard drinks    Comment: occ  . Drug use: No     Allergies   Shellfish allergy, Bee venom, and Hydrocodone   Review of Systems Review of Systems  Constitutional: Negative for chills and fever.  HENT: Negative for ear pain, sore throat and trouble swallowing.   Eyes: Negative for pain and visual disturbance.  Respiratory: Negative for cough and shortness of breath.   Cardiovascular: Negative for chest pain and palpitations.  Gastrointestinal: Negative for abdominal pain and vomiting.  Genitourinary: Negative for dysuria and hematuria.  Musculoskeletal: Negative for arthralgias and back pain.  Skin: Positive for wound. Negative for  color change.  Neurological: Negative for seizures and syncope.  All other systems reviewed and are negative.    Physical Exam Triage Vital Signs ED Triage Vitals  Enc Vitals Group     BP 04/06/19 1623 (!) 141/97     Pulse Rate 04/06/19 1623 72     Resp 04/06/19 1623 16     Temp 04/06/19 1623 98.6 F (37 C)     Temp Source 04/06/19 1623 Oral     SpO2 04/06/19 1623 99 %     Weight --      Height --      Head Circumference --      Peak Flow --      Pain Score 04/06/19 1622 0     Pain Loc --      Pain Edu? --      Excl. in [place]? --    No data found.  Updated Vital Signs BP (!) 141/97 (BP Location: Left Arm)   Pulse 72   Temp 98.6 F (37 C) (Oral)   Resp 16   SpO2 99%   Visual Acuity Right Eye Distance:   Left Eye Distance:   Bilateral Distance:    Right Eye Near:   Left Eye Near:    Bilateral Near:     Physical Exam Vitals signs and nursing note reviewed.  Constitutional:      General: She is not in acute distress.    Appearance: She is well-developed.  HENT:     Head: Normocephalic and atraumatic.     Mouth/Throat:     Mouth: Mucous membranes are moist.     Pharynx: Oropharynx is clear.  Eyes:     Conjunctiva/sclera: Conjunctivae normal.  Neck:     Musculoskeletal: Neck supple.  Cardiovascular:     Rate and Rhythm: Normal rate and regular rhythm.     Heart sounds: No murmur.  Pulmonary:     Effort: Pulmonary effort is normal. No respiratory distress.     Breath sounds: Normal breath sounds.  Abdominal:     Palpations: Abdomen is soft.     Tenderness: There is no abdominal tenderness. There is no guarding or rebound.  Skin:    General: Skin is warm and dry.     Findings: Lesion present.     Comments: Two pustules on right hand, two on right side of neck, and one on right side of chin: No drainage; localized edema and erythema.     Neurological:  General: No focal deficit present.     Mental Status: She is alert and oriented to person, place, and  time.     Sensory: No sensory deficit.     Motor: No weakness.      UC Treatments / Results  Labs (all labs ordered are listed, but only abnormal results are displayed) Labs Reviewed - No data to display  EKG   Radiology No results found.  Procedures Procedures (including critical care time)  Medications Ordered in UC Medications - No data to display  Initial Impression / Assessment and Plan / UC Course  I have reviewed the triage vital signs and the nursing notes.  Pertinent labs & imaging results that were available during my care of the patient were reviewed by me and considered in my medical decision making (see chart for details).   Insect bites.  Treating with Keflex and prednisone.  Instructed patient she can also take Benadryl as needed for itching.  Wound care instructions and signs of infection discussed with patient.  Patient agrees to plan of care.    Final Clinical Impressions(s) / UC Diagnoses   Final diagnoses:  Insect bite, unspecified site, initial encounter     Discharge Instructions     Take the antibiotic cephalexin and steroid prednisone as directed.  You can also take Benadryl as needed for itching.  Keep your wounds clean and dry.  Wash them gently twice a day with soap and water.    Return here if you see signs of infection, such as increased pain, redness, pus-like drainage, warmth, fever, chills, or other concerning symptoms.         ED Prescriptions    Medication Sig Dispense Auth. Provider   cephALEXin (KEFLEX) 500 MG capsule Take 1 capsule (500 mg total) by mouth 3 (three) times daily. 28 capsule Barkley Boards H, NP   predniSONE (STERAPRED UNI-PAK 21 TAB) 10 MG (21) TBPK tablet Take by mouth daily. Take 6 tabs by mouth daily  for 1 day, then 5 tabs for 1 day, then 4 tabs for 1 day, then 3 tabs for 1 day, 2 tabs for 1 day, then 1 tab by mouth daily for 1 day 21 tablet Sharion Balloon, NP     PDMP not reviewed this encounter.   Sharion Balloon, NP 04/06/19 1735

## 2019-04-06 NOTE — Discharge Instructions (Addendum)
Take the antibiotic cephalexin and steroid prednisone as directed.  You can also take Benadryl as needed for itching.  Keep your wounds clean and dry.  Wash them gently twice a day with soap and water.    Return here if you see signs of infection, such as increased pain, redness, pus-like drainage, warmth, fever, chills, or other concerning symptoms.

## 2019-12-10 ENCOUNTER — Ambulatory Visit (HOSPITAL_COMMUNITY)
Admission: EM | Admit: 2019-12-10 | Discharge: 2019-12-10 | Disposition: A | Discharge status: Home / Self Care | Payer: Medicaid Other | Attending: Family Medicine | Admitting: Family Medicine

## 2019-12-10 ENCOUNTER — Encounter (HOSPITAL_COMMUNITY): Payer: Self-pay

## 2019-12-10 ENCOUNTER — Other Ambulatory Visit: Payer: Self-pay

## 2019-12-10 DIAGNOSIS — H8110 Benign paroxysmal vertigo, unspecified ear: Secondary | ICD-10-CM

## 2019-12-10 MED ORDER — MECLIZINE HCL 25 MG PO TABS: 25.0000 mg | ORAL_TABLET | Freq: Three times a day (TID) | ORAL | 1 refills | Status: AC | PRN

## 2019-12-10 MED ORDER — ONDANSETRON 4 MG PO TBDP: 4.0000 mg | ORAL_TABLET | Freq: Three times a day (TID) | ORAL | 0 refills | Status: DC | PRN

## 2019-12-10 NOTE — ED Triage Notes (Signed)
Pt reports she fell on the floor this morning when she woke up, "everything was spinning". Pt states the last time she had a vertigo episode took her 3 months to recover.

## 2019-12-10 NOTE — ED Provider Notes (Signed)
[name]   [id number] 12/10/19 Arrival Time: 1250  ASSESSMENT & PLAN:  1. Benign paroxysmal positional vertigo, unspecified laterality     Normal neurologic exam. No suspicion for ICH or SAH. No indication for neurodiagnostic imaging at this time. Discussed.  Meds ordered this encounter  Medications   meclizine (ANTIVERT) 25 MG tablet    Sig: Take 1 tablet (25 mg total) by mouth 3 (three) times daily as needed for dizziness.    Dispense:  30 tablet    Refill:  1   ondansetron (ZOFRAN-ODT) 4 MG disintegrating tablet    Sig: Take 1 tablet (4 mg total) by mouth every 8 (eight) hours as needed for nausea or vomiting.    Dispense:  15 tablet    Refill:  0    Reassured that these symptoms do not appear to represent a serious or threatening condition. This is generally a self-limited temporary but uncomfortable situation. Rest, avoid potentially dangerous activities (such as driving or working with machinery or at heights). Use OTC Meclizine prn. Will proceed to the ED if he develops other symptoms such as alterations of speech, swallowing, vision, motor/sensory systems, or if dizziness worsens.  Reviewed expectations re: course of current medical issues. Questions answered. Outlined signs and symptoms indicating need for more acute intervention. Patient verbalized understanding. After Visit Summary given.   SUBJECTIVE:  Mikayla Hansen is a 39 y.o. female who reports abrupt onset of dizziness described as vertigo. H/O similar late last year; symptoms continued for 3 months before resolving. Current symptoms first noted today and have progressed to a point and plateaued. Experiencing symptoms frequently with episodes typically lasting until she can sit still. Aggravating factors: head movements. Denies headaches, seizures, speech problems and weakness as well as aural pressure and otalgia. Recent infections: none. Head trauma: denied. Noise exposure: no occupational  exposure. No associated SOB, CP, or palpatations reported. Recent travel: none. Reports normal bowel/bladder habits. Therapies tried thus far: none.  Social History   Substance and Sexual Activity  Alcohol Use Yes   Alcohol/week: 0.0 standard drinks   Comment: occ   Social History   Tobacco Use  Smoking Status Current Every Day Smoker   Packs/day: 1.00   Types: Cigarettes  Smokeless Tobacco Never Used   Denies illegal drug use.    OBJECTIVE:  Vitals:   12/10/19 1444  BP: (!) 143/99  Pulse: 73  Resp: 18  Temp: 98.3 F (36.8 C)  TempSrc: Oral  SpO2: 100%    General appearance: alert; no distress Eyes: PERRLA; EOMI; conjunctiva normal HENT: normocephalic; atraumatic; TMs normal; nasal mucosa normal; oral mucosa normal Neck: supple with FROM Lungs: unlabored; speaks full sentences without difficulty; CTAB Heart: regular Abdomen: soft, non-tender; bowel sounds normal Extremities: no edema; symmetrical with no gross deformities Skin: warm and dry Neurologic: prefers not to ambulate secondary to vertigo with head movements; no gross neurological abnormalities Psychological: alert and cooperative; normal mood and affect    Allergies  Allergen Reactions   Shellfish Allergy Anaphylaxis   Bee Venom Hives and Swelling   Hydrocodone Palpitations    Past Medical History:  Diagnosis Date   Abnormal Pap smear    Asthma    Bacterial vaginosis 09-07-2011   Gonorrhea 2007   Hypertension    Infection    Social History   Socioeconomic History   Marital status: Single    Spouse name: Not on file   Number of children: Not on file   Years of education: Not on  file   Highest education level: Not on file  Occupational History   Not on file  Tobacco Use   Smoking status: Current Every Day Smoker    Packs/day: 1.00    Types: Cigarettes   Smokeless tobacco: Never Used  Vaping Use   Vaping Use: Never used  Substance and Sexual Activity    Alcohol use: Yes    Alcohol/week: 0.0 standard drinks    Comment: occ   Drug use: No   Sexual activity: Yes    Partners: Male    Birth control/protection: Surgical  Other Topics Concern   Not on file  Social History Narrative   Lives with 4 children and husband (married for 17 years). 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   Social Determinants of Health   Financial Resource Strain:    Difficulty of Paying Living Expenses:   Food Insecurity:    Worried About Charity fundraiser in the Last Year:    Arboriculturist in the Last Year:   Transportation Needs:    Film/video editor (Medical):    Lack of Transportation (Non-Medical):   Physical Activity:    Days of Exercise per Week:    Minutes of Exercise per Session:   Stress:    Feeling of Stress :   Social Connections:    Frequency of Communication with Friends and Family:    Frequency of Social Gatherings with Friends and Family:    Attends Religious Services:    Active Member of Clubs or Organizations:    Attends Music therapist:    Marital Status:   Intimate Partner Violence:    Fear of Current or Ex-Partner:    Emotionally Abused:    Physically Abused:    Sexually Abused:    Family History  Problem Relation Age of Onset   Hypertension Mother    Seizures Mother    Anesthesia problems Neg Hx    Past Surgical History:  Procedure Laterality Date   TUBAL LIGATION  2004   WISDOM TOOTH EXTRACTION  2009      Vanessa Kick, MD 12/10/19 1642

## 2020-03-19 ENCOUNTER — Ambulatory Visit (HOSPITAL_COMMUNITY)
Admission: EM | Admit: 2020-03-19 | Discharge: 2020-03-19 | Disposition: A | Discharge status: Home / Self Care | Payer: Medicaid Other | Attending: Family Medicine | Admitting: Family Medicine

## 2020-03-19 ENCOUNTER — Other Ambulatory Visit: Payer: Self-pay

## 2020-03-19 ENCOUNTER — Encounter (HOSPITAL_COMMUNITY): Payer: Self-pay

## 2020-03-19 DIAGNOSIS — K047 Periapical abscess without sinus: Secondary | ICD-10-CM

## 2020-03-19 DIAGNOSIS — K0889 Other specified disorders of teeth and supporting structures: Secondary | ICD-10-CM

## 2020-03-19 MED ORDER — FLUCONAZOLE 200 MG PO TABS: 200.0000 mg | ORAL_TABLET | Freq: Once | ORAL | 0 refills | Status: AC

## 2020-03-19 MED ORDER — AMOXICILLIN-POT CLAVULANATE 875-125 MG PO TABS: 1.0000 | ORAL_TABLET | Freq: Two times a day (BID) | ORAL | 0 refills | Status: AC

## 2020-03-19 NOTE — ED Provider Notes (Signed)
[place]    CSN: [id number] Arrival date & time: 03/19/20  1045      History   Chief Complaint Chief Complaint  Patient presents with  . Dental Pain  . Oral Swelling    HPI Mikayla Hansen is a 39 y.o. female.   Reports that she has been experiencing left upper jaw pain since last night.  Also reports that her left cheek is swollen.  Reports that she has a funny taste in her mouth since yesterday.  Reports that she needs a tooth pulled to the left upper jaw on her mouth.  Reports that she does not have insurance at this time so she cannot have that done right now.  Symptoms are worse with chewing and talking.  She has not taken OTC medication for this.  Denies headache, cough, shortness of breath, nausea, vomiting, diarrhea, rash, fever, other symptoms.  ROS per HPI  The history is provided by the patient.  Dental Pain   Past Medical History:  Diagnosis Date  . Abnormal Pap smear   . Asthma   . Bacterial vaginosis 09-07-2011  . Gonorrhea 2007  . Hypertension   . Infection     Patient Active Problem List   Diagnosis Date Noted  . Other specified glaucoma 06/28/2017  . Alpha+ thalassemia ([place]) 12/28/2016  . Poor high blood pressure control 05/20/2015  . Uterine leiomyoma 05/06/2015  . Anxiety 04/29/2014  . Chest pain 04/29/2014  . Essential hypertension 04/29/2014  . Anal itching 02/18/2014  . Asthma 11/04/2013  . Vaginal candidiasis 10/09/2011  . Tobacco dependence 09/11/2011  . Bacterial vaginosis 09/07/2011  . Obesity (BMI 30-39.9) 09/07/2011  . History of asthma 09/07/2011    Past Surgical History:  Procedure Laterality Date  . TUBAL LIGATION  2004  . WISDOM TOOTH EXTRACTION  2009    OB History    Gravida  4   Para  4   Term  3   Preterm  1   AB  0   Living  4     SAB  0   TAB  0   Ectopic  0   Multiple  0   Live Births           Obstetric Comments  Birth control: Tubal ligation in 2004         Home  Medications    Prior to Admission medications   Medication Sig Start Date End Date Taking? Authorizing Provider  albuterol (PROVENTIL HFA) 108 (90 BASE) MCG/ACT inhaler Inhale 2 puffs into the lungs every 6 (six) hours as needed for wheezing.  06/21/14   [provider]  amLODipine (NORVASC) 5 MG tablet Take 2 tablets (10 mg total) by mouth daily. 03/10/19   Loura Halt A, NP  amoxicillin-clavulanate (AUGMENTIN) 875-125 MG tablet Take 1 tablet by mouth 2 (two) times daily for 10 days. 03/19/20 03/29/20  Faustino Congress, NP  fluconazole (DIFLUCAN) 200 MG tablet Take 1 tablet (200 mg total) by mouth once for 1 dose. 03/19/20 03/19/20  Faustino Congress, NP  fluticasone (FLONASE) 50 MCG/ACT nasal spray Place 1 spray into both nostrils daily. 03/10/19   Bast, Tressia Miners A, NP  HYDROCHLOROTHIAZIDE PO Take 5 mg by mouth daily.    [provider]  meclizine (ANTIVERT) 25 MG tablet Take 1 tablet (25 mg total) by mouth 3 (three) times daily as needed for dizziness. 12/10/19   Vanessa Kick, MD  ondansetron (ZOFRAN-ODT) 4 MG disintegrating tablet Take 1 tablet (4 mg  total) by mouth every 8 (eight) hours as needed for nausea or vomiting. 12/10/19   Vanessa Kick, MD  predniSONE (STERAPRED UNI-PAK 21 TAB) 10 MG (21) TBPK tablet Take by mouth daily. Take 6 tabs by mouth daily  for 1 day, then 5 tabs for 1 day, then 4 tabs for 1 day, then 3 tabs for 1 day, 2 tabs for 1 day, then 1 tab by mouth daily for 1 day 04/06/19   Sharion Balloon, NP    Family History Family History  Problem Relation Age of Onset  . Hypertension Mother   . Seizures Mother   . Anesthesia problems Neg Hx     Social History Social History   Tobacco Use  . Smoking status: Current Every Day Smoker    Packs/day: 1.00    Types: Cigarettes  . Smokeless tobacco: Never Used  Vaping Use  . Vaping Use: Never used  Substance Use Topics  . Alcohol use: Yes    Alcohol/week: 0.0 standard drinks    Comment: occ  . Drug use: No      Allergies   Shellfish allergy, Bee venom, and Hydrocodone   Review of Systems Review of Systems   Physical Exam Triage Vital Signs ED Triage Vitals  Enc Vitals Group     BP 03/19/20 1228 (!) 169/107     Pulse Rate 03/19/20 1228 79     Resp 03/19/20 1228 18     Temp 03/19/20 1228 98.5 F (36.9 C)     Temp Source 03/19/20 1228 Oral     SpO2 03/19/20 1228 99 %     Weight --      Height --      Head Circumference --      Peak Flow --      Pain Score 03/19/20 1229 9     Pain Loc --      Pain Edu? --      Excl. in [place]? --    No data found.  Updated Vital Signs BP (!) 169/107 (BP Location: Left Arm)   Pulse 79   Temp 98.5 F (36.9 C) (Oral)   Resp 18   LMP 03/18/2020   SpO2 99%    Physical Exam Vitals and nursing note reviewed.  Constitutional:      General: She is not in acute distress.    Appearance: Normal appearance. She is well-developed.  HENT:     Head: Normocephalic and atraumatic.     Nose: Nose normal.     Mouth/Throat:     Mouth: Mucous membranes are moist.     Dentition: Gingival swelling present.     Pharynx: Oropharynx is clear.      Comments: Area of broken infected tooth, left cheek swelling Eyes:     Conjunctiva/sclera: Conjunctivae normal.  Cardiovascular:     Rate and Rhythm: Normal rate and regular rhythm.     Heart sounds: Normal heart sounds. No murmur heard.   Pulmonary:     Effort: Pulmonary effort is normal. No respiratory distress.     Breath sounds: Normal breath sounds.  Abdominal:     Palpations: Abdomen is soft.     Tenderness: There is no abdominal tenderness.  Musculoskeletal:        General: Normal range of motion.     Cervical back: Normal range of motion and neck supple.  Skin:    General: Skin is warm and dry.     Capillary Refill: Capillary refill takes less than 2  seconds.  Neurological:     General: No focal deficit present.     Mental Status: She is alert and oriented to person, place, and time.   Psychiatric:        Mood and Affect: Mood normal.        Behavior: Behavior normal.        Thought Content: Thought content normal.      UC Treatments / Results  Labs (all labs ordered are listed, but only abnormal results are displayed) Labs Reviewed - No data to display  EKG   Radiology No results found.  Procedures Procedures (including critical care time)  Medications Ordered in UC Medications - No data to display  Initial Impression / Assessment and Plan / UC Course  I have reviewed the triage vital signs and the nursing notes.  Pertinent labs & imaging results that were available during my care of the patient were reviewed by me and considered in my medical decision making (see chart for details).     Dental pain Dental infection  Present presents with dental pain for the last day.  Broken tooth with gingivitis noted to left upper jaw.  Is not followed regularly by dentistry. Prescribed Augmentin Prescribed fluconazole in case of yeast Gave dental resource guide to patient so that she may get the tooth pulled Follow-up with dentistry Follow-up with the ER for trouble swallowing, trouble breathing, other concerning symptoms Final Clinical Impressions(s) / UC Diagnoses   Final diagnoses:  Pain, dental  Dental infection     Discharge Instructions     You have a dental infection.  I have sent in Augmentin for you to take twice a day for 10 days.  I have sent in fluconazole in case of yeast.  Take 1 tablet on the first day of symptoms, if you are still having symptoms after 3 days, take the second tablet.  I have attached information for you for dentist around here that we will help work with you without insurance.  If symptoms are getting acutely worse, go to the ER.    ED Prescriptions    Medication Sig Dispense Auth. Provider   amoxicillin-clavulanate (AUGMENTIN) 875-125 MG tablet Take 1 tablet by mouth 2 (two) times daily for 10 days. 20 tablet  Faustino Congress, NP   fluconazole (DIFLUCAN) 200 MG tablet Take 1 tablet (200 mg total) by mouth once for 1 dose. 2 tablet Faustino Congress, NP     PDMP not reviewed this encounter.   Faustino Congress, NP 03/19/20 1256

## 2020-03-19 NOTE — Discharge Instructions (Addendum)
You have a dental infection.  I have sent in Augmentin for you to take twice a day for 10 days.  I have sent in fluconazole in case of yeast.  Take 1 tablet on the first day of symptoms, if you are still having symptoms after 3 days, take the second tablet.  I have attached information for you for dentist around here that we will help work with you without insurance.  If symptoms are getting acutely worse, go to the ER.

## 2020-03-19 NOTE — ED Triage Notes (Signed)
Pt presents with left side dental pain and facial swelling since last night.

## 2020-09-27 ENCOUNTER — Encounter: Payer: Self-pay | Admitting: Women's Health

## 2020-09-28 ENCOUNTER — Ambulatory Visit (INDEPENDENT_AMBULATORY_CARE_PROVIDER_SITE_OTHER): Payer: Medicaid Other | Admitting: Women's Health

## 2020-09-28 ENCOUNTER — Other Ambulatory Visit: Payer: Self-pay

## 2020-09-28 ENCOUNTER — Other Ambulatory Visit (HOSPITAL_COMMUNITY)
Admission: RE | Admit: 2020-09-28 | Discharge: 2020-09-28 | Disposition: A | Discharge status: Home / Self Care | Payer: Medicaid Other | Source: Ambulatory Visit | Attending: Women's Health | Admitting: Women's Health

## 2020-09-28 ENCOUNTER — Encounter: Payer: Self-pay | Admitting: Women's Health

## 2020-09-28 VITALS — BP 121/84 | HR 84 | Ht 64.0 in | Wt 157.0 lb

## 2020-09-28 DIAGNOSIS — Z113 Encounter for screening for infections with a predominantly sexual mode of transmission: Secondary | ICD-10-CM

## 2020-09-28 DIAGNOSIS — N924 Excessive bleeding in the premenopausal period: Secondary | ICD-10-CM

## 2020-09-28 DIAGNOSIS — D251 Intramural leiomyoma of uterus: Secondary | ICD-10-CM

## 2020-09-28 NOTE — Progress Notes (Signed)
Patient presents for problem visit today. C/o heavy periods.   LMP: 09/12/20  Cycles last 7 days, pt states cycles are beyond heavy. Pt soaking 3-4 pads in 1 hr. Associated w/ large clots and cramps.  Pt has discussed Ablation in the past , but wants to try other measures first considering contraception.  Contraception: None  Last pap: 07/30/2016

## 2020-09-28 NOTE — Progress Notes (Signed)
History:  [name] is a 40 y.o. [id number] who presents to clinic today for have menstrual bleeding. Patient had an ultrasound about 2 years ago which showed she had fibroids, and was offered an ablation by Dr. Elly Modena at that time. Patient was not interested in surgery and still reports she would like to try other methods prior to surgery. Patient was seen by PCP on 09/26/2020 and found to have anemia and started on oral iron. Patient also had normal TSH on 08/03/2020. Patient reports she has not had another [place] to check on the status of her fibroids since 10/2016. Last Pap 10/2016 - NILM, HPV neg.  Patient presents to office today as bleeding and pain are getting significantly worse. Patient reports she is unable to work during her periods because she is bleeding so much and soaking multiple pads in one hour and having blood dripping over the sides. Patient reports she passes extremely large clots and sometimes feels like she is passing a baby when they come out.  Patient also requests STD testing today.  The following portions of the patient's history were reviewed and updated as appropriate: allergies, current medications, family history, past medical history, social history, past surgical history and problem list.  Review of Systems:  Review of Systems  Genitourinary:       +vaginal bleeding +pelvic pain     Objective:  Physical Exam BP 121/84   Pulse 84   Ht 5\' 4"  (1.626 m)   Wt 157 lb (71.2 kg)   LMP 09/12/2020 (Exact Date)   BMI 26.95 kg/m    Physical Exam Vitals and nursing note reviewed. Exam conducted with a chaperone present.  Constitutional:      General: She is not in acute distress.    Appearance: Normal appearance. She is not ill-appearing, toxic-appearing or diaphoretic.  HENT:     Head: Normocephalic and atraumatic.  Pulmonary:     Effort: Pulmonary effort is normal.  Abdominal:     Palpations: Abdomen is soft.     Tenderness: There is abdominal  tenderness in the right upper quadrant, suprapubic area and left lower quadrant.     Comments: Generalized lower abdominal tenderness.  Genitourinary:    General: Normal vulva.     Labia:        Right: No rash, tenderness, lesion or injury.        Left: No rash, tenderness, lesion or injury.      Vagina: Normal. No vaginal discharge or bleeding.     Cervix: No cervical motion tenderness, discharge, friability or cervical bleeding.     Adnexa:        Right: No mass, tenderness or fullness.         Left: No mass, tenderness or fullness.       Comments: Likely fibroids felt around posterior uterine segment, consistent with US findings 10/2016. Patient identifies this as area of tenderness. Neurological:     Mental Status: She is alert and oriented to person, place, and time.  Psychiatric:        Mood and Affect: Mood normal.        Behavior: Behavior normal.        Thought Content: Thought content normal.        Judgment: Judgment normal.    Labs and Imaging No results found for this or any previous visit (from the past 24 hour(s)).  No results found.   Assessment & Plan:  1. Excessive bleeding in premenopausal period -  discussed use of Aleve for first 2-3 days of menses, scheduled, as patient reports this works for her - US PELVIC COMPLETE WITH TRANSVAGINAL; Future  2. Intramural leiomyoma of uterus - pending [place] results will discuss what options patient has available to treat HMB - US PELVIC COMPLETE WITH TRANSVAGINAL; Future  3. Routine screening for STI (sexually transmitted infection) - Cervicovaginal ancillary only( [place]) - HIV antibody (with reflex) - RPR - Hepatitis B Surface AntiGEN - Hepatitis C Antibody  Approximately 15 minutes of total time was spent with this patient on counseling, physical exam.  Clarisa Fling, NP 09/28/2020 9:34 AM

## 2020-09-28 NOTE — Patient Instructions (Signed)
Uterine Fibroids  Uterine fibroids, also called leiomyomas, are noncancerous (benign) tumors that can grow in the uterus. They can cause heavy menstrual bleeding and pain. Fibroids may also grow in the fallopian tubes, cervix, or tissues (ligaments) near the uterus. You may have one or many fibroids. Fibroids vary in size, weight, and where they grow in the uterus. Some can become quite large. Most fibroids do not require medical treatment. What are the causes? The cause of this condition is not known. What increases the risk? You are more likely to develop this condition if you:  Are in your 30s or 40s and have not gone through menopause.  Have a family history of this condition.  Are of African American descent.  Started your menstrual period at age 40 or younger.  Have never given birth.  Are overweight or obese. What are the signs or symptoms? Many women do not have any symptoms. Symptoms of this condition may include:  Heavy menstrual bleeding.  Bleeding between menstrual periods.  Pain and pressure in the pelvic area, between your hip bones.  Pain during sex.  Bladder problems, such as needing to urinate right away or more often than usual.  Inability to have children (infertility).  Failure to carry pregnancy to term (miscarriage). How is this diagnosed? This condition may be diagnosed based on:  Your symptoms and medical history.  A physical exam.  A pelvic exam that includes feeling for any tumors.  Imaging tests, such as ultrasound or MRI. How is this treated? Treatment for this condition may include follow-up visits with your health care provider to monitor your fibroids for any changes. Other treatment may include:  Medicines, such as: ? Medicines to relieve pain, including aspirin and NSAIDs, such as ibuprofen or naproxen. ? Hormone therapy. Treatment may be given as a pill or an injection, or it may be inserted into the uterus using an intrauterine  device (IUD).  Surgery that would do one of the following: ? Remove the fibroids (myomectomy). This may be recommended if fibroids affect your fertility and you want to become pregnant. ? Remove the uterus (hysterectomy). ? Block the blood supply to the fibroids (uterine artery embolization). This can cause them to shrink and die. Follow these instructions at home: Medicines  Take over-the-counter and prescription medicines only as told by your health care provider.  Ask your health care provider if you should take iron pills or eat more iron-rich foods, such as dark green, leafy vegetables. Heavy menstrual bleeding can cause low iron levels. Managing pain If directed, apply heat to your back or abdomen to reduce pain. Use the heat source that your health care provider recommends, such as a moist heat pack or a heating pad. To apply heat:  Place a towel between your skin and the heat source.  Leave the heat on for 20-30 minutes.  Remove the heat if your skin turns bright red. This is especially important if you are unable to feel pain, heat, or cold. You may have a greater risk of getting burned.   General instructions  Pay close attention to your menstrual cycle. Tell your health care provider about any changes, such as: ? Heavier bleeding that requires you to change your pads or tampons more than usual. ? A change in the number of days that your menstrual period lasts. ? A change in symptoms that come with your menstrual period, such as back pain or cramps in your abdomen.  Keep all follow-up visits. This is  important, especially if your fibroids need to be monitored for any changes. Contact a health care provider if you:  Have pelvic pain, back pain, or cramps in your abdomen that do not get better with medicine or heat.  Develop new bleeding between menstrual periods.  Have increased bleeding during or between menstrual periods.  Feel more tired or weak than usual.  Feel  light-headed. Get help right away if you:  Faint.  Have pelvic pain that suddenly gets worse.  Have severe vaginal bleeding that soaks a tampon or pad in 30 minutes or less. Summary  Uterine fibroids are noncancerous (benign) tumors that can develop in the uterus.  The exact cause of this condition is not known.  Most fibroids do not require medical treatment unless they affect your ability to have children (fertility).  Contact a health care provider if you have pelvic pain, back pain, or cramps in your abdomen that do not get better with medicines.  Get help right away if you faint, have pelvic pain that suddenly gets worse, or have severe vaginal bleeding. This information is not intended to replace advice given to you by your health care provider. Make sure you discuss any questions you have with your health care provider. Document Revised: 01/05/2020 Document Reviewed: 01/05/2020 Elsevier Patient Education  [place].

## 2020-09-29 LAB — CERVICOVAGINAL ANCILLARY ONLY
Chlamydia: NEGATIVE
Comment: NEGATIVE
Comment: NEGATIVE
Comment: NORMAL
Neisseria Gonorrhea: NEGATIVE
Trichomonas: NEGATIVE

## 2020-09-29 LAB — HIV ANTIBODY (ROUTINE TESTING W REFLEX): HIV Screen 4th Generation wRfx: NONREACTIVE

## 2020-09-29 LAB — HEPATITIS C ANTIBODY: Hep C Virus Ab: <0.1 s/co ratio (ref 0.0–0.9)

## 2020-09-29 LAB — RPR: RPR Ser Ql: NONREACTIVE

## 2020-09-29 LAB — HEPATITIS B SURFACE ANTIGEN: Hepatitis B Surface Ag: NEGATIVE

## 2020-10-19 ENCOUNTER — Other Ambulatory Visit: Payer: Medicaid Other

## 2020-11-08 ENCOUNTER — Ambulatory Visit
Admission: RE | Admit: 2020-11-08 | Discharge: 2020-11-08 | Disposition: A | Discharge status: Home / Self Care | Payer: Medicaid Other | Source: Ambulatory Visit | Attending: Women's Health | Admitting: Women's Health

## 2020-11-08 ENCOUNTER — Other Ambulatory Visit: Payer: Self-pay

## 2020-11-08 DIAGNOSIS — D251 Intramural leiomyoma of uterus: Secondary | ICD-10-CM | POA: Diagnosis present

## 2020-11-08 DIAGNOSIS — N924 Excessive bleeding in the premenopausal period: Secondary | ICD-10-CM | POA: Diagnosis present

## 2020-11-08 NOTE — Progress Notes (Signed)
Please call pt to confirm presence of fibroids on [place] and to offer appt with MD ONLY to discuss next steps in management of pain/bleeding associated with fibroids.  Thank you, Elmyra Ricks

## 2020-12-13 ENCOUNTER — Ambulatory Visit: Payer: Medicaid Other | Admitting: Family Medicine

## 2020-12-26 ENCOUNTER — Encounter: Payer: Self-pay | Admitting: Obstetrics and Gynecology

## 2020-12-26 ENCOUNTER — Ambulatory Visit (INDEPENDENT_AMBULATORY_CARE_PROVIDER_SITE_OTHER): Payer: Medicaid Other | Admitting: Obstetrics and Gynecology

## 2020-12-26 ENCOUNTER — Other Ambulatory Visit: Payer: Self-pay

## 2020-12-26 VITALS — BP 155/95 | HR 76 | Ht 64.0 in | Wt 167.0 lb

## 2020-12-26 DIAGNOSIS — D251 Intramural leiomyoma of uterus: Secondary | ICD-10-CM | POA: Diagnosis not present

## 2020-12-26 DIAGNOSIS — Z3042 Encounter for surveillance of injectable contraceptive: Secondary | ICD-10-CM

## 2020-12-26 DIAGNOSIS — N920 Excessive and frequent menstruation with regular cycle: Secondary | ICD-10-CM | POA: Diagnosis not present

## 2020-12-26 DIAGNOSIS — D250 Submucous leiomyoma of uterus: Secondary | ICD-10-CM

## 2020-12-26 DIAGNOSIS — D252 Subserosal leiomyoma of uterus: Secondary | ICD-10-CM

## 2020-12-26 DIAGNOSIS — Z712 Person consulting for explanation of examination or test findings: Secondary | ICD-10-CM

## 2020-12-26 LAB — POCT URINE PREGNANCY: Preg Test, Ur: NEGATIVE

## 2020-12-26 MED ORDER — MEDROXYPROGESTERONE ACETATE 150 MG/ML IM SUSP
150.0000 mg | Freq: Once | INTRAMUSCULAR | Status: AC
  Administered 2020-12-26: 150 mg via INTRAMUSCULAR

## 2020-12-26 MED ORDER — NORETHINDRONE ACETATE 5 MG PO TABS: 5.0000 mg | ORAL_TABLET | Freq: Every day | ORAL | 12 refills | Status: DC

## 2020-12-26 MED ORDER — MEDROXYPROGESTERONE ACETATE 150 MG/ML IM SUSP: 150.0000 mg | INTRAMUSCULAR | 5 refills | Status: DC

## 2020-12-26 NOTE — Addendum Note (Signed)
Addended by: Cydney Ok B on: 12/26/2020 11:14 AM   Modules accepted: Orders

## 2020-12-26 NOTE — Progress Notes (Addendum)
Pt here today to discuss ultrasound results from 11/08/20. LMP: 12/16/20  Depo Provera 150mg  given IM RUOQ. Pt tolerated well with no adverse side effects noted. Pt to return between 03/13/21-03/27/21 for repeat injection. Pt scheduled injection at checkout.

## 2020-12-26 NOTE — Progress Notes (Signed)
40 yo P4 here to discuss ultrasound results and discuss further management of her menorrhagia. Patient reports a monthly period lasting 7-10 days, heavy in flow and associated with dysmenorrhea. She often has to miss work due to the dysmenorrhea. Patient is not interested in a hysterectomy.  Past Medical History:  Diagnosis Date   Abnormal Pap smear    Asthma    Bacterial vaginosis 09-07-2011   Glaucoma    Gonorrhea 2007   Hypertension    Infection    Past Surgical History:  Procedure Laterality Date   TUBAL LIGATION  2004   WISDOM TOOTH EXTRACTION  2009   Family History  Problem Relation Age of Onset   Hypertension Mother    Seizures Mother    Anesthesia problems Neg Hx    Social History   Tobacco Use   Smoking status: Every Day    Packs/day: 1.00    Pack years: 0.00    Types: Cigarettes   Smokeless tobacco: Never  Vaping Use   Vaping Use: Never used  Substance Use Topics   Alcohol use: Yes    Alcohol/week: 0.0 standard drinks    Comment: occ   Drug use: No    ROS See pertinent in HPI. All other systems reviewed and non contributory Blood pressure (!) 155/95, pulse 76, height 5\' 4"  (1.626 m), weight 167 lb (75.8 kg), last menstrual period 12/16/2020.  GENERAL: Well-developed, well-nourished female in no acute distress.  NEURO: alert and oriented x 3  US PELVIC COMPLETE WITH TRANSVAGINAL  Result Date: 11/08/2020 CLINICAL DATA:  Heavy menstrual bleeding, history of fibroids, LMP 10/30/2020 EXAM: TRANSABDOMINAL AND TRANSVAGINAL ULTRASOUND OF PELVIS TECHNIQUE: Both transabdominal and transvaginal ultrasound examinations of the pelvis were performed. Transabdominal technique was performed for global imaging of the pelvis including uterus, ovaries, adnexal regions, and pelvic cul-de-sac. It was necessary to proceed with endovaginal exam following the transabdominal exam to visualize the LEFT ovary. COMPARISON:  10/31/2016 FINDINGS: Uterus Measurements: 12.9 x 8.3 x 9.6 cm  = volume: 540 mL. Anteverted. Heterogeneous myometrium. Multiple masses consistent with leiomyomata. LEFT fundal mass 5.7 x 5.9 x 5.3 cm, transmural. Central uterine mass 3.1 x 2.1 x 2.5 cm, submucosal. Additional posterior mid uterine mass, subserosal, 4.4 x 3.9 x 4.8 cm. Endometrium Thickness: 14 mm.  No endometrial fluid or focal abnormality Right ovary Measurements: 4.4 x 1.9 x 2.9 cm = volume: 12.5 mL. Normal morphology without mass Left ovary Measurements: 4.2 x 1.8 x 2.1 cm = volume: 8.1 mL. Normal morphology without mass Other findings No free pelvic fluid.  No adnexal masses. IMPRESSION: Mildly enlarged uterus with multiple leiomyomata, at least 2 of which extend submucosal. Remainder of exam unremarkable. Electronically Signed   By: Lavonia Dana M.D.   On: 11/08/2020 11:54     A/P 40 yo with fibroid uterus and menorrhagia with regular cycles - Patient is not interested in surgical interventions at this time - Discussed medical management with Aygestin and other contraception options. Patient agrees to aygestin and desires to restart depo-provera - Will refer patient to Dr. Elgie Congo for evaluation for [place] - RTC prn

## 2021-03-14 ENCOUNTER — Telehealth: Payer: Self-pay | Admitting: *Deleted

## 2021-03-14 NOTE — Telephone Encounter (Signed)
TC from patient reporting that she has been bleeding since last depo injection. Is also taking BCP. Wants to know if she should still come tomorrow for depo injection. Advised patient to come for depo as scheduled and to schedule an appointment to be seen again in the office due to concern for continued bleeding despite depo and BCP and concern for anemia.

## 2021-03-16 ENCOUNTER — Ambulatory Visit (INDEPENDENT_AMBULATORY_CARE_PROVIDER_SITE_OTHER): Payer: Medicaid Other

## 2021-03-16 ENCOUNTER — Other Ambulatory Visit: Payer: Self-pay

## 2021-03-16 DIAGNOSIS — Z3042 Encounter for surveillance of injectable contraceptive: Secondary | ICD-10-CM

## 2021-03-16 MED ORDER — MEDROXYPROGESTERONE ACETATE 150 MG/ML IM SUSP
150.0000 mg | INTRAMUSCULAR | Status: DC
  Administered 2021-03-16: 150 mg via INTRAMUSCULAR

## 2021-03-16 NOTE — Progress Notes (Signed)
Patient was assessed and managed by nursing staff during this encounter. I have reviewed the chart and agree with the documentation and plan. I have also made any necessary editorial changes.  Mora Bellman, MD 03/16/2021 12:10 PM

## 2021-03-16 NOTE — Progress Notes (Signed)
Pt is in the office for depo injection and is wanting to discuss other options with provider related to bleeding. Advised pt that if she has time to wait, provider will work her into schedule to speak with her. Pt was teary eyed and stated that she would just get the injection today and schedule another appt. Advised pt that she does not have to get injection today if she does not want to, pt stated that she will proceed with injection today. Administered depo in LUOQ and pt tolerated well. Next due Dec 15-29, pt scheduled consult appt with provider for Oct 17th. .. Administrations This Visit     medroxyPROGESTERone (DEPO-PROVERA) injection 150 mg     Admin Date 03/16/2021 Action Given Dose 150 mg Route Intramuscular Administered By Hinton Lovely, RN

## 2021-04-03 ENCOUNTER — Other Ambulatory Visit (HOSPITAL_COMMUNITY)
Admission: RE | Admit: 2021-04-03 | Discharge: 2021-04-03 | Disposition: A | Discharge status: Home / Self Care | Payer: Medicaid Other | Source: Ambulatory Visit | Attending: Obstetrics and Gynecology | Admitting: Obstetrics and Gynecology

## 2021-04-03 ENCOUNTER — Ambulatory Visit (INDEPENDENT_AMBULATORY_CARE_PROVIDER_SITE_OTHER): Payer: Medicaid Other | Admitting: Obstetrics and Gynecology

## 2021-04-03 ENCOUNTER — Other Ambulatory Visit: Payer: Self-pay

## 2021-04-03 ENCOUNTER — Encounter: Payer: Self-pay | Admitting: Obstetrics and Gynecology

## 2021-04-03 VITALS — BP 136/87 | HR 86 | Ht 64.0 in | Wt 168.0 lb

## 2021-04-03 DIAGNOSIS — D250 Submucous leiomyoma of uterus: Secondary | ICD-10-CM

## 2021-04-03 DIAGNOSIS — D252 Subserosal leiomyoma of uterus: Secondary | ICD-10-CM | POA: Diagnosis not present

## 2021-04-03 DIAGNOSIS — Z124 Encounter for screening for malignant neoplasm of cervix: Secondary | ICD-10-CM | POA: Diagnosis present

## 2021-04-03 DIAGNOSIS — D251 Intramural leiomyoma of uterus: Secondary | ICD-10-CM | POA: Diagnosis not present

## 2021-04-03 DIAGNOSIS — N921 Excessive and frequent menstruation with irregular cycle: Secondary | ICD-10-CM | POA: Diagnosis not present

## 2021-04-03 NOTE — Progress Notes (Signed)
40 yo P4 presenting today for evaluation of persistent vaginal bleeding on depo-provera. Patient started depo-provera in July for the management of menorrhagia with regular cycles. Patient states persistent vaginal bleeding with depo-provera. She describes it as sometimes heavy, sometimes light. She went 1 week without vaginal bleeding. Patient received her second dose of depo-provera in September and has not seen any improvement. Patient is not interested in a hysterectomy  Past Medical History:  Diagnosis Date   Abnormal Pap smear    Asthma    Bacterial vaginosis 09-07-2011   Glaucoma    Gonorrhea 2007   Hypertension    Infection    Past Surgical History:  Procedure Laterality Date   TUBAL LIGATION  2004   WISDOM TOOTH EXTRACTION  2009   Family History  Problem Relation Age of Onset   Hypertension Mother    Seizures Mother    Anesthesia problems Neg Hx    Social History   Tobacco Use   Smoking status: Every Day    Packs/day: 1.00    Types: Cigarettes   Smokeless tobacco: Never  Vaping Use   Vaping Use: Never used  Substance Use Topics   Alcohol use: Yes    Alcohol/week: 0.0 standard drinks    Comment: occ   Drug use: No   ROS See pertinent in HPI. All other systems reviewed and non contributory  Blood pressure 136/87, pulse 86, height 5\' 4"  (1.626 m), weight 168 lb (76.2 kg). GENERAL: Well-developed, well-nourished female in no acute distress.  ABDOMEN: Soft, nontender, nondistended. No organomegaly. PELVIC: Normal external female genitalia. Vagina is pink and rugated.  Normal discharge. Normal appearing cervix. Uterus is 14-weeks in size. No adnexal mass or tenderness. Chaperone present during the pelvic exam EXTREMITIES: No cyanosis, clubbing, or edema, 2+ distal pulses.  A/P 40 yo with menorrhagia and fibroid uterus - pap smear collected today - Discussed endometrial ablation for cycle control and explained that it will not remove her fibroids. Patient desires  fibroid removal without myomectomy and is referred to Dr. Elgie Congo for [facility] consultation - Patient will be contacted with abnormal results

## 2021-04-03 NOTE — Progress Notes (Signed)
GYN presents for vaginal bleeding, pain 11/10 x 2 months. Changing overnight pads 2-3 times per hour.  Patient is currently on DEPO.

## 2021-04-05 LAB — CYTOLOGY - PAP
Comment: NEGATIVE
Diagnosis: NEGATIVE
High risk HPV: NEGATIVE

## 2021-04-12 ENCOUNTER — Ambulatory Visit (INDEPENDENT_AMBULATORY_CARE_PROVIDER_SITE_OTHER): Payer: Medicaid Other | Admitting: Obstetrics and Gynecology

## 2021-04-12 ENCOUNTER — Other Ambulatory Visit: Payer: Self-pay

## 2021-04-12 VITALS — BP 128/85 | HR 85 | Wt 164.0 lb

## 2021-04-12 DIAGNOSIS — D250 Submucous leiomyoma of uterus: Secondary | ICD-10-CM | POA: Diagnosis not present

## 2021-04-12 DIAGNOSIS — D251 Intramural leiomyoma of uterus: Secondary | ICD-10-CM

## 2021-04-12 DIAGNOSIS — N921 Excessive and frequent menstruation with irregular cycle: Secondary | ICD-10-CM | POA: Diagnosis not present

## 2021-04-12 DIAGNOSIS — N920 Excessive and frequent menstruation with regular cycle: Secondary | ICD-10-CM | POA: Insufficient documentation

## 2021-04-12 MED ORDER — MEGESTROL ACETATE 20 MG PO TABS: 20.0000 mg | ORAL_TABLET | Freq: Two times a day (BID) | ORAL | 5 refills | Status: DC

## 2021-04-12 NOTE — Progress Notes (Signed)
  CC: treatment consultation Subjective:    Patient ID: Mikayla Hansen, female    DOB: 1980/07/25, 40 y.o.   MRN: [id number]  HPI 40 yo G4P4 , SVD x 4seen for consultation for treatment of menorrhagia and fibroids.  Pt notes 5 year history of heavy menses.  Prior to depo provera treatment menses were regular and lasted 7-10 days.  Since her two doses of depo provera she has had continued irregular bleeding.  She has also tried aygestin as well.     Review of Systems     Objective:   Physical Exam Vitals:   04/12/21 0843  BP: 128/85  Pulse: 85   CLINICAL DATA:  Heavy menstrual bleeding, history of fibroids, LMP 10/30/2020   EXAM: TRANSABDOMINAL AND TRANSVAGINAL ULTRASOUND OF PELVIS   TECHNIQUE: Both transabdominal and transvaginal ultrasound examinations of the pelvis were performed. Transabdominal technique was performed for global imaging of the pelvis including uterus, ovaries, adnexal regions, and pelvic cul-de-sac. It was necessary to proceed with endovaginal exam following the transabdominal exam to visualize the LEFT ovary.   COMPARISON:  10/31/2016   FINDINGS: Uterus   Measurements: 12.9 x 8.3 x 9.6 cm = volume: 540 mL. Anteverted. Heterogeneous myometrium. Multiple masses consistent with leiomyomata. LEFT fundal mass 5.7 x 5.9 x 5.3 cm, transmural. Central uterine mass 3.1 x 2.1 x 2.5 cm, submucosal. Additional posterior mid uterine mass, subserosal, 4.4 x 3.9 x 4.8 cm.   Endometrium   Thickness: 14 mm.  No endometrial fluid or focal abnormality   Right ovary   Measurements: 4.4 x 1.9 x 2.9 cm = volume: 12.5 mL. Normal morphology without mass   Left ovary   Measurements: 4.2 x 1.8 x 2.1 cm = volume: 8.1 mL. Normal morphology without mass   Other findings   No free pelvic fluid.  No adnexal masses.   IMPRESSION: Mildly enlarged uterus with multiple leiomyomata, at least 2 of which extend submucosal.   Remainder of exam unremarkable.       Assessment & Plan:   1. Intramural and submucous leiomyoma of uterus Discussed with pt her treatment options.  Uterine artery embolization [place] procedure Hysterectomy My recommendation would be a choice of either [place] or [place].  Information given to [employer] and pamphlet given. Information on other procedures placed in AVS Hysterectomy would be last resort.  Pt reassured that without removal of ovaries, her hormones should remain normal. 2. Menorrhagia with irregular cycle Will try oral megace BID for control while pt decides on treatment option  - megestrol (MEGACE) 20 MG tablet; Take 1 tablet (20 mg total) by mouth 2 (two) times daily.  Dispense: 60 tablet; Refill: 5  I spent 20 minutes dedicated to the care of this patient including previsit review of records, face to face time with the patient discussing disease etiology, treatment options and post visit testing.   Pt desired some time to discuss options with her family. Will follow up in 3 months to discuss with virtual visit  Griffin Basil, MD Faculty Attending, [facility]

## 2021-04-12 NOTE — Patient Instructions (Signed)
[email or web address]

## 2021-04-12 NOTE — Progress Notes (Signed)
Pt in office for consult for bleeding control, ?ablation. Pt received 2nd dose of Depo in September.  Pt states bleeding is the same- heavy flow.

## 2021-06-14 ENCOUNTER — Telehealth: Payer: Self-pay | Admitting: Physician Assistant

## 2021-06-14 NOTE — Telephone Encounter (Signed)
Scheduled appt per 12/20 referral. Pt is aware of appt date and time. Pt is aware to arrive 15 mins prior to appt time.

## 2021-06-26 NOTE — Progress Notes (Signed)
[place] [phone]) [phone]   Fax:([phone]) [place] NOTE  Patient Care Team: Smothers, Andree Elk, NP as PCP - General (Nurse Practitioner)  Hematological/Oncological History 1) Iron deficiency anemia -08/03/2020: WBC 4.8, Hgb 9.8, MCV 61.1, Plt 20.7, Ferritin 5 -08/19/2020: WBC 6.2. 10.1, MCV 60.9, Plt 412 -09/26/2020: WBC 6.5, Hgb 11.2, MCV 64.1, Plt 340, Ferritin 6.4 -12/28/2020: WBC 7.2, Hgb 11.1, MCV 67.5, Plt 336, Ferritin 5.2 -06/05/2021: WBC 6.8, Hgb 9.2, MCV 59.4, Plt 502, Ferritin 4.2  2) 06/27/2021: Establish care at [facility]:  Iron deficiency anemia  HISTORY OF PRESENTING ILLNESS:  Mikayla Hansen 40 y.o. female with medical history significant for hypertension, asthma, glaucoma, leiomyoma of the uterus and menorrhagia.  On exam today, Mikayla Hansen reports severe fatigue that has been present for several months. She adds that she needs to rest frequently and is in bed a majority of the day. She has a good appetite without any dietary restrictions. She denies nausea or vomiting.  She reports constipation due to taking iron pills.  She has a bowel movement every 3 to 4 days.  She is trying conservative measures with prune juice and increasing fiber in her diet.  She adds that she has been having lower abdominal pain with a bowel movement for several months.  Patient reports heavy menstrual bleeding for several years but has daily menstrual bleeding after receiving Depo injections.  The last injection was in September 2022.  She wears 2 pads at a time and changes it several times within the hour.  Patient was prescribed Megace to help slow down menstrual bleeding but she has not started the medication due to the potential side effects.  Patient reports craving cornstarch for the last several months she has occasional episodes of headaches throughout the week that she contributes to sinusitis.   Patient has history of glaucoma in both eyes but has noticed blurry vision in the left eye for the past week. She plans to follow up with her ophthalmologist soon.  Patient has shortness of breath mainly with heavy exertion.She denies fevers, chills, night sweats, chest pain or cough.  She has no other complaints. Rest of the 10 point ROS is below.   Past Medical History:  Diagnosis Date   Abnormal Pap smear    Asthma    Bacterial vaginosis 09-07-2011   Glaucoma    Gonorrhea 2007   Hypertension    Infection     Past Surgical History:  Procedure Laterality Date   TUBAL LIGATION  2004   WISDOM TOOTH EXTRACTION  2009    Family History  Problem Relation Age of Onset   Hypertension Mother    Seizures Mother    Cancer Paternal Grandfather    Anesthesia problems Neg Hx     Social History   Socioeconomic History   Marital status: Single    Spouse name: Not on file   Number of children: Not on file   Years of education: Not on file   Highest education level: Not on file  Occupational History   Not on file  Tobacco Use   Smoking status: Every Day    Packs/day: 1.00    Years: 20.00    Pack years: 20.00    Types: Cigarettes   Smokeless tobacco: Never   Tobacco comments:    Trying to quit and now smoking 1 cigarette a day. On nicotine patch.   Vaping Use   Vaping Use: Never  used  Substance and Sexual Activity   Alcohol use: Yes    Alcohol/week: 0.0 standard drinks    Comment: occ   Drug use: No   Sexual activity: Yes    Partners: Male    Birth control/protection: Surgical  Other Topics Concern   Not on file  Social History Narrative   Lives with 4 children and husband (married for 17 years). 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   Social Determinants of Health   Financial Resource Strain: Not on file  Food Insecurity: Not on file  Transportation Needs: Not on file  Physical Activity: Not on file  Stress: Not on file  Social Connections: Not on file   Intimate Partner Violence: Not on file    Outpatient Medications Prior to Visit  Medication Sig Dispense Refill   albuterol (VENTOLIN HFA) 108 (90 Base) MCG/ACT inhaler Inhale 2 puffs into the lungs every 6 (six) hours as needed for wheezing.      amLODipine (NORVASC) 5 MG tablet Take 2 tablets (10 mg total) by mouth daily. 30 tablet 1   chlorthalidone (HYGROTON) 25 MG tablet Take 25 mg by mouth daily.     FERROUS SULFATE PO Take 325 mg by mouth. Taking one PO every other day     HYDROCHLOROTHIAZIDE PO Take 5 mg by mouth daily.     meclizine (ANTIVERT) 25 MG tablet Take 1 tablet (25 mg total) by mouth 3 (three) times daily as needed for dizziness. 30 tablet 1   megestrol (MEGACE) 20 MG tablet Take 1 tablet (20 mg total) by mouth 2 (two) times daily. 60 tablet 5   fluticasone (FLONASE) 50 MCG/ACT nasal spray Place 1 spray into both nostrils daily. (Patient not taking: Reported on 06/27/2021) 16 g 2   No facility-administered medications prior to visit.    Allergies  Allergen Reactions   Shellfish Allergy Anaphylaxis   Bee Venom Hives and Swelling   Hydrocodone Palpitations    ROS Review of Systems  Constitutional:  Positive for fatigue. Negative for appetite change, chills, diaphoresis, fever and unexpected weight change.  HENT:  Positive for congestion and sinus pressure.   Eyes:  Positive for visual disturbance (blurry vision in left eye).  Respiratory:  Positive for shortness of breath (mainly with exertion). Negative for cough, chest tightness and wheezing.   Cardiovascular:  Negative for chest pain and leg swelling.  Gastrointestinal:  Positive for abdominal pain (lower pelvic pain with bowel movement) and constipation. Negative for blood in stool, diarrhea, nausea and vomiting.  Skin:  Negative for pallor and rash.  Neurological:  Positive for headaches. Negative for dizziness, light-headedness and numbness.     Objective:    Physical Exam Constitutional:       Appearance: Normal appearance. She is not ill-appearing.  HENT:     Head: Normocephalic.     Mouth/Throat:     Mouth: Mucous membranes are moist.     Pharynx: Oropharynx is clear.  Eyes:     General: No scleral icterus.    Extraocular Movements: Extraocular movements intact.     Conjunctiva/sclera: Conjunctivae normal.     Pupils: Pupils are equal, round, and reactive to light.  Cardiovascular:     Rate and Rhythm: Normal rate and regular rhythm.  Pulmonary:     Effort: Pulmonary effort is normal.     Breath sounds: Normal breath sounds.  Abdominal:     Palpations: There is no mass.     Tenderness: There is abdominal tenderness (  in epigastric region and lower pelvic region). There is no guarding.  Musculoskeletal:        General: No swelling or tenderness.  Skin:    General: Skin is warm.  Neurological:     General: No focal deficit present.     Mental Status: She is alert and oriented to person, place, and time.  Psychiatric:        Mood and Affect: Mood normal.    BP (!) 135/99 (BP Location: Left Arm, Patient Position: Sitting)    Pulse 82    Temp (!) 97 F (36.1 C) (Temporal)    Resp 18    Wt 166 lb 6 oz (75.5 kg)    SpO2 100%    BMI 28.56 kg/m  Wt Readings from Last 3 Encounters:  06/27/21 166 lb 6 oz (75.5 kg)  04/12/21 164 lb (74.4 kg)  04/03/21 168 lb (76.2 kg)     Health Maintenance Due  Topic Date Due   COVID-19 Vaccine (1) Never done   Pneumococcal Vaccine 35-57 Years old (1 - PCV) Never done   INFLUENZA VACCINE  01/16/2021    There are no preventive care reminders to display for this patient.  Lab Results  Component Value Date   TSH 1.564 04/28/2015   Lab Results  Component Value Date   WBC 5.8 06/27/2021   HGB 8.9 (L) 06/27/2021   HCT 30.4 (L) 06/27/2021   MCV 59.6 (L) 06/27/2021   PLT 397 06/27/2021   Lab Results  Component Value Date   NA 137 01/16/2017   K 3.7 01/16/2017   CO2 24 01/16/2017   GLUCOSE 114 (H) 01/16/2017   BUN 5 (L)  01/16/2017   CREATININE 0.66 01/16/2017   BILITOT 0.3 04/28/2015   ALKPHOS 66 04/28/2015   AST 15 04/28/2015   ALT 14 04/28/2015   PROT 7.6 04/28/2015   ALBUMIN 4.4 04/28/2015   CALCIUM 9.1 01/16/2017   ANIONGAP 8 01/16/2017   Lab Results  Component Value Date   CHOL 181 04/28/2015   Lab Results  Component Value Date   HDL 50 04/28/2015   Lab Results  Component Value Date   LDLCALC 100 (H) 09/07/2011   Lab Results  Component Value Date   TRIG 65 04/28/2015   Lab Results  Component Value Date   CHOLHDL 3.8 09/07/2011   No results found for: HGBA1C    Assessment & Plan:   CAYLOR CERINO is a 41 y.o. female who presents to the clinic for initial evaluation for iron deficiency anemia.  Patient has history of fibroids and menorrhagia for the past 5 years.   She currently takes ferrous sulfate 325 mg once every other day due to constipation.   Patient will proceed with serologic evaluation today and if there is evidence of persistent iron deficiency anemia, will arrange IV iron infusions.  #Iron deficiency anemia 2/2 menstrual bleeding: --Labs today to check CBC, CMP, iron and TIBC, ferritin and retic panel --If there is persistent iron deficiency anemia with Hgb <10, recommend IV iron. We will request IV monoferric 1000 mg x 1 dose.  --Gave list of iron rich foods to incorporate into her diet --Continue ferrous sulfate 325 mg once every other day and take stool softeners daily to minimize constipation.  --RTC in 8 weeks with repeat labs.   #Constipation and lower abdominal pain with bowel movements: --Sent referral to [facility] for further evaluation.  #Age appropriate screenings:  --Patient reports she is up to date with  mammogram and had last one yesterday, awaiting results.  --Patient currently smokes and trying to quit. Has 20 pack year history and would be eligible for low dose CT scan for lung cancer screening at age 85.  --GI team will determine if she  needs colonoscopy sooner than recommended (45 years). --Patient underwent PAP smear in October 2022 that shows no evidence of malignancy.   Problem List Items Addressed This Visit   None Visit Diagnoses     Iron deficiency anemia due to chronic blood loss    -  Primary   Relevant Medications   FERROUS SULFATE PO   Other Relevant Orders   CBC with Differential (Cancer Center Only) (Completed)   CMP ([facility] only)   Ferritin   Iron and Iron Binding Capacity ([facility] only)   Retic Panel (Completed)      All questions were answered. The patient knows to call the clinic with any problems, questions or concerns.  I have spent a total of 60 minutes minutes of face-to-face and non-face-to-face time, preparing to see the patient, obtaining and/or reviewing separately obtained history, performing a medically appropriate examination, counseling and educating the patient, ordering medications/tests, documenting clinical information in the electronic health record,  and care coordination.   Dede Query, PA-C Department of Hematology/Oncology [place] at [facility] Phone: [phone]  Patient was seen with Dr. Lorenso Courier.   I have read the above note and personally examined the patient. I agree with the assessment and plan as noted above.  Briefly Mikayla Hansen is a 41 year old female who presents for evaluation of iron deficiency anemia 2/2 to heavy GYN bleeding. She has difficulty tolerating PO iron therapy due to constipation so we will schedule her for IV iron treatment. In the interim we will try QOD PO iron with stool softeners and iron rich food. We will plan to see patients back in 4-6 weeks after her last dose of IV iron.    Ledell Peoples, MD Department of Hematology/Oncology [place] at [facility] Phone: [phone] Pager: [phone] Email: [email or web address]

## 2021-06-27 ENCOUNTER — Inpatient Hospital Stay: Payer: Medicaid Other

## 2021-06-27 ENCOUNTER — Inpatient Hospital Stay: Payer: Medicaid Other | Attending: Physician Assistant | Admitting: Physician Assistant

## 2021-06-27 ENCOUNTER — Telehealth: Payer: Self-pay

## 2021-06-27 ENCOUNTER — Encounter: Payer: Self-pay | Admitting: Physician Assistant

## 2021-06-27 ENCOUNTER — Other Ambulatory Visit: Payer: Self-pay

## 2021-06-27 ENCOUNTER — Telehealth: Payer: Self-pay | Admitting: Pharmacy Technician

## 2021-06-27 VITALS — BP 135/99 | HR 82 | Temp 97.0°F | Resp 18 | Wt 166.4 lb

## 2021-06-27 DIAGNOSIS — K5900 Constipation, unspecified: Secondary | ICD-10-CM | POA: Insufficient documentation

## 2021-06-27 DIAGNOSIS — J45909 Unspecified asthma, uncomplicated: Secondary | ICD-10-CM | POA: Insufficient documentation

## 2021-06-27 DIAGNOSIS — D500 Iron deficiency anemia secondary to blood loss (chronic): Secondary | ICD-10-CM

## 2021-06-27 DIAGNOSIS — Z820 Family history of epilepsy and other diseases of the nervous system: Secondary | ICD-10-CM | POA: Diagnosis not present

## 2021-06-27 DIAGNOSIS — Z809 Family history of malignant neoplasm, unspecified: Secondary | ICD-10-CM | POA: Diagnosis not present

## 2021-06-27 DIAGNOSIS — N920 Excessive and frequent menstruation with regular cycle: Secondary | ICD-10-CM | POA: Insufficient documentation

## 2021-06-27 DIAGNOSIS — R5383 Other fatigue: Secondary | ICD-10-CM | POA: Diagnosis not present

## 2021-06-27 DIAGNOSIS — R198 Other specified symptoms and signs involving the digestive system and abdomen: Secondary | ICD-10-CM

## 2021-06-27 DIAGNOSIS — R519 Headache, unspecified: Secondary | ICD-10-CM | POA: Insufficient documentation

## 2021-06-27 DIAGNOSIS — R1030 Lower abdominal pain, unspecified: Secondary | ICD-10-CM | POA: Insufficient documentation

## 2021-06-27 DIAGNOSIS — Z885 Allergy status to narcotic agent status: Secondary | ICD-10-CM | POA: Insufficient documentation

## 2021-06-27 DIAGNOSIS — Z79899 Other long term (current) drug therapy: Secondary | ICD-10-CM | POA: Insufficient documentation

## 2021-06-27 DIAGNOSIS — Z86018 Personal history of other benign neoplasm: Secondary | ICD-10-CM | POA: Diagnosis not present

## 2021-06-27 DIAGNOSIS — Z8249 Family history of ischemic heart disease and other diseases of the circulatory system: Secondary | ICD-10-CM | POA: Insufficient documentation

## 2021-06-27 DIAGNOSIS — Z91030 Bee allergy status: Secondary | ICD-10-CM | POA: Diagnosis not present

## 2021-06-27 DIAGNOSIS — H409 Unspecified glaucoma: Secondary | ICD-10-CM | POA: Insufficient documentation

## 2021-06-27 DIAGNOSIS — I10 Essential (primary) hypertension: Secondary | ICD-10-CM | POA: Insufficient documentation

## 2021-06-27 LAB — CBC WITH DIFFERENTIAL (CANCER CENTER ONLY)
Abs Immature Granulocytes: 0.02 10*3/uL (ref 0.00–0.07)
Basophils Absolute: 0 10*3/uL (ref 0.0–0.1)
Basophils Relative: 1 %
Eosinophils Absolute: 0.2 10*3/uL (ref 0.0–0.5)
Eosinophils Relative: 3 %
HCT: 30.4 % — ABNORMAL LOW (ref 36.0–46.0)
Hemoglobin: 8.9 g/dL — ABNORMAL LOW (ref 12.0–15.0)
Immature Granulocytes: 0 %
Lymphocytes Relative: 26 %
Lymphs Abs: 1.5 10*3/uL (ref 0.7–4.0)
MCH: 17.5 pg — ABNORMAL LOW (ref 26.0–34.0)
MCHC: 29.3 g/dL — ABNORMAL LOW (ref 30.0–36.0)
MCV: 59.6 fL — ABNORMAL LOW (ref 80.0–100.0)
Monocytes Absolute: 0.5 10*3/uL (ref 0.1–1.0)
Monocytes Relative: 8 %
Neutro Abs: 3.6 10*3/uL (ref 1.7–7.7)
Neutrophils Relative %: 62 %
Platelet Count: 397 10*3/uL (ref 150–400)
RBC: 5.1 MIL/uL (ref 3.87–5.11)
RDW: 20.2 % — ABNORMAL HIGH (ref 11.5–15.5)
Smear Review: NORMAL
WBC Count: 5.8 10*3/uL (ref 4.0–10.5)
nRBC: 0 % (ref 0.0–0.2)

## 2021-06-27 LAB — RETIC PANEL
Immature Retic Fract: 33.8 % — ABNORMAL HIGH (ref 2.3–15.9)
RBC.: 5.11 MIL/uL (ref 3.87–5.11)
Retic Count, Absolute: 49.1 10*3/uL (ref 19.0–186.0)
Retic Ct Pct: 1 % (ref 0.4–3.1)
Reticulocyte Hemoglobin: 17.9 pg — ABNORMAL LOW (ref >27.9)

## 2021-06-27 LAB — CMP (CANCER CENTER ONLY)
ALT: 12 U/L (ref 0–44)
AST: 19 U/L (ref 15–41)
Albumin: 4.2 g/dL (ref 3.5–5.0)
Alkaline Phosphatase: 51 U/L (ref 38–126)
Anion gap: 7 (ref 5–15)
BUN: 10 mg/dL (ref 6–20)
CO2: 25 mmol/L (ref 22–32)
Calcium: 9.2 mg/dL (ref 8.9–10.3)
Chloride: 105 mmol/L (ref 98–111)
Creatinine: 0.74 mg/dL (ref 0.44–1.00)
GFR, Estimated: >60 mL/min (ref >60)
Glucose, Bld: 94 mg/dL (ref 70–99)
Potassium: 3.7 mmol/L (ref 3.5–5.1)
Sodium: 137 mmol/L (ref 135–145)
Total Bilirubin: 0.5 mg/dL (ref 0.3–1.2)
Total Protein: 7.5 g/dL (ref 6.5–8.1)

## 2021-06-27 LAB — IRON AND IRON BINDING CAPACITY (CC-WL,HP ONLY)
Iron: 23 ug/dL — ABNORMAL LOW (ref 28–170)
Saturation Ratios: 4 % — ABNORMAL LOW (ref 10.4–31.8)
TIBC: 528 ug/dL — ABNORMAL HIGH (ref 250–450)
UIBC: 505 ug/dL — ABNORMAL HIGH (ref 148–442)

## 2021-06-27 LAB — FERRITIN: Ferritin: 6 ng/mL — ABNORMAL LOW (ref 11–307)

## 2021-06-27 NOTE — Telephone Encounter (Signed)
can you make referral to [facility]  Referral faxed and confirmation received

## 2021-06-27 NOTE — Telephone Encounter (Signed)
Pt advised with understanding. 

## 2021-06-27 NOTE — Telephone Encounter (Signed)
-----   Message from Lincoln Brigham, PA-C sent at 06/27/2021 11:26 AM EST ----- Please notify patient that labs show iron deficiency anemia and she requires IV iron therapy. We will make arrangements at [facility].

## 2021-06-27 NOTE — Telephone Encounter (Addendum)
Dr. Allena Napoleon NOTE:  Auth Submission: no Josem Kaufmann needed Payer: UHC/MEDICAID Medication & CPT/J Code(s) submitted: MONOFERRIC Route of submission (phone, fax, portal): PORTAL/PHONE Auth type: Buy/Bill Units/visits requested: 1 Reference number: automated ref# [id number] REP: Raven.-W. Approval from: 06/27/21 to 09/25/21  Patient will be scheduled as soon as possible.

## 2021-06-28 ENCOUNTER — Telehealth: Payer: Self-pay | Admitting: Physician Assistant

## 2021-06-28 NOTE — Telephone Encounter (Signed)
Scheduled per 1/10 los, message has been left with pt

## 2021-06-30 ENCOUNTER — Encounter: Payer: Self-pay | Admitting: Physician Assistant

## 2021-07-03 ENCOUNTER — Other Ambulatory Visit: Payer: Self-pay

## 2021-07-03 ENCOUNTER — Ambulatory Visit (INDEPENDENT_AMBULATORY_CARE_PROVIDER_SITE_OTHER): Payer: Medicaid Other

## 2021-07-03 VITALS — BP 158/99 | HR 79 | Temp 99.1°F | Resp 16 | Ht 64.0 in | Wt 168.4 lb

## 2021-07-03 DIAGNOSIS — D500 Iron deficiency anemia secondary to blood loss (chronic): Secondary | ICD-10-CM | POA: Diagnosis not present

## 2021-07-03 MED ORDER — METHYLPREDNISOLONE SODIUM SUCC 125 MG IJ SOLR: 125.0000 mg | Freq: Once | INTRAMUSCULAR | Status: DC | PRN

## 2021-07-03 MED ORDER — FAMOTIDINE IN NACL 20-0.9 MG/50ML-% IV SOLN: 20.0000 mg | Freq: Once | INTRAVENOUS | Status: DC | PRN

## 2021-07-03 MED ORDER — SODIUM CHLORIDE 0.9 % IV SOLN
1000.0000 mg | Freq: Once | INTRAVENOUS | Status: AC
  Administered 2021-07-03: 1000 mg via INTRAVENOUS
  Filled 2021-07-03: qty 10

## 2021-07-03 MED ORDER — ALBUTEROL SULFATE HFA 108 (90 BASE) MCG/ACT IN AERS: 2.0000 | INHALATION_SPRAY | Freq: Once | RESPIRATORY_TRACT | Status: DC | PRN

## 2021-07-03 MED ORDER — EPINEPHRINE 0.3 MG/0.3ML IJ SOAJ: 0.3000 mg | Freq: Once | INTRAMUSCULAR | Status: DC | PRN

## 2021-07-03 MED ORDER — SODIUM CHLORIDE 0.9 % IV SOLN: Freq: Once | INTRAVENOUS | Status: DC | PRN

## 2021-07-03 MED ORDER — DIPHENHYDRAMINE HCL 50 MG/ML IJ SOLN: 50.0000 mg | Freq: Once | INTRAMUSCULAR | Status: DC | PRN

## 2021-07-03 NOTE — Progress Notes (Addendum)
Diagnosis: Iron Deficiency Anemia  Provider:  Marshell Garfinkel, MD  Procedure: Infusion  IV Type: Peripheral, IV Location: L Antecubital  Monoferric, Dose: 1000 mg  Infusion Start Time: 1505  Infusion Stop Time: 1120  Post Infusion IV Care: Observation period completed and Peripheral IV Discontinued  Discharge: Condition: Good, Destination: Home . AVS provided to patient.   Performed by:  Karan Inclan, Sherlon Handing, LPN

## 2021-07-09 ENCOUNTER — Ambulatory Visit (HOSPITAL_COMMUNITY)
Admission: EM | Admit: 2021-07-09 | Discharge: 2021-07-09 | Disposition: A | Discharge status: Home / Self Care | Payer: Medicaid Other | Attending: Emergency Medicine | Admitting: Emergency Medicine

## 2021-07-09 ENCOUNTER — Other Ambulatory Visit: Payer: Self-pay

## 2021-07-09 ENCOUNTER — Encounter (HOSPITAL_COMMUNITY): Payer: Self-pay

## 2021-07-09 DIAGNOSIS — L03211 Cellulitis of face: Secondary | ICD-10-CM

## 2021-07-09 MED ORDER — PENICILLIN G BENZATHINE 1200000 UNIT/2ML IM SUSY
2.4000 10*6.[IU] | PREFILLED_SYRINGE | Freq: Once | INTRAMUSCULAR | Status: AC
Start: 2021-07-09 — End: 2021-07-09
  Administered 2021-07-09: 2.4 10*6.[IU] via INTRAMUSCULAR

## 2021-07-09 MED ORDER — AMOXICILLIN-POT CLAVULANATE 875-125 MG PO TABS: 1.0000 | ORAL_TABLET | Freq: Two times a day (BID) | ORAL | 0 refills | Status: DC

## 2021-07-09 MED ORDER — PENICILLIN G BENZATHINE 1200000 UNIT/2ML IM SUSY
PREFILLED_SYRINGE | INTRAMUSCULAR | Status: AC
  Filled 2021-07-09: qty 4

## 2021-07-09 NOTE — ED Provider Notes (Signed)
[place]    CSN: [id number] Arrival date & time: 07/09/21  1051    HISTORY   Chief Complaint  Patient presents with   Facial Swelling   Facial Pain   HPI Mikayla Hansen is a 41 y.o. female. She presents with right upper lip pain and swelling that began yesterday.  Patient states she initially noticed that she had some's mild swelling of her right upper lip just below her right nare that was painless, states that began yesterday morning and then throughout the day and overnight began to increase in size and become painful, patient states the pain intensified this morning so she decided to come to urgent care.  Patient states she had hurt her nose "a little while back", states she is not having trouble with her teeth or her gums, denies dental pain or poor dentition.  Patient's blood pressure is very elevated on arrival today, patient states she is not taking her blood pressure meds yet.  Patient denies any history of immune compromised, illicit drug abuse.  Patient denies recent upper respiratory infection, trauma to the inside of her mouth.  Patient denies fever, is, chills, nausea, vomiting, diarrhea, sore throat, otalgia.  The history is provided by the patient.  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   Poor high blood pressure control 05/20/2015   Uterine leiomyoma 05/06/2015   Anxiety 04/29/2014   Chest pain 04/29/2014   Essential hypertension 04/29/2014   Anal itching 02/18/2014   Asthma 11/04/2013   Vaginal candidiasis 10/09/2011   Tobacco dependence 09/11/2011   Bacterial vaginosis 09/07/2011   Obesity (BMI 30-39.9)  09/07/2011   History of asthma 09/07/2011   Past Surgical History:  Procedure Laterality Date   TUBAL LIGATION  2004   WISDOM TOOTH EXTRACTION  2009   OB History     Gravida  4   Para  4   Term  3   Preterm  1   AB  0   Living  4      SAB  0   IAB  0   Ectopic  0   Multiple  0   Live Births           Obstetric Comments  Birth control: Tubal ligation in 2004        Home Medications    Prior to Admission medications   Medication Sig Start Date End Date Taking? Authorizing Provider  albuterol (VENTOLIN HFA) 108 (90 Base) MCG/ACT inhaler Inhale 2 puffs into the lungs every 6 (six) hours as needed for wheezing.  06/21/14   [provider]  amLODipine (NORVASC) 5 MG tablet Take 2 tablets (10 mg total) by mouth daily. 03/10/19   Loura Halt A, NP  chlorthalidone (HYGROTON) 25 MG tablet Take 25 mg by mouth daily.    [provider]  FERROUS SULFATE PO Take 325 mg by mouth. Taking one PO every other day    [provider]  fluticasone (FLONASE) 50 MCG/ACT nasal spray Place 1 spray into both nostrils daily. Patient not taking: Reported on 06/27/2021 03/10/19   Rozanna Box,  Traci A, NP  HYDROCHLOROTHIAZIDE PO Take 5 mg by mouth daily.    [provider]  meclizine (ANTIVERT) 25 MG tablet Take 1 tablet (25 mg total) by mouth 3 (three) times daily as needed for dizziness. 12/10/19   Vanessa Kick, MD  megestrol (MEGACE) 20 MG tablet Take 1 tablet (20 mg total) by mouth 2 (two) times daily. 04/12/21   Griffin Basil, MD   Family History Family History  Problem Relation Age of Onset   Hypertension Mother    Seizures Mother    Cancer Paternal Grandfather    Anesthesia problems Neg Hx    Social History Social History   Tobacco Use   Smoking status: Every Day    Packs/day: 1.00    Years: 20.00    Pack years: 20.00    Types: Cigarettes   Smokeless tobacco: Never   Tobacco comments:    Trying to quit and now smoking 1 cigarette a day. On  nicotine patch.   Vaping Use   Vaping Use: Never used  Substance Use Topics   Alcohol use: Yes    Alcohol/week: 0.0 standard drinks    Comment: occ   Drug use: No   Allergies   Shellfish allergy, Bee venom, and Hydrocodone  Review of Systems Review of Systems Pertinent findings noted in history of present illness.   Physical Exam Triage Vital Signs ED Triage Vitals  Enc Vitals Group     BP 04/14/21 0827 (!) 147/82     Pulse Rate 04/14/21 0827 72     Resp 04/14/21 0827 18     Temp 04/14/21 0827 98.3 F (36.8 C)     Temp Source 04/14/21 0827 Oral     SpO2 04/14/21 0827 98 %     Weight --      Height --      Head Circumference --      Peak Flow --      Pain Score 04/14/21 0826 5     Pain Loc --      Pain Edu? --      Excl. in [place]? --   No data found.  Updated Vital Signs BP (!) 152/104 (BP Location: Left Arm) Comment: has not taken blood pressure medication in 2 days   Pulse 92    Temp 98.2 F (36.8 C) (Oral)    Resp 18    SpO2 100%   Physical Exam Vitals and nursing note reviewed.  Constitutional:      General: She is not in acute distress.    Appearance: Normal appearance. She is not ill-appearing.  HENT:     Head: Normocephalic and atraumatic. No raccoon eyes, Battle's sign, abrasion, contusion, masses, right periorbital erythema, left periorbital erythema or laceration. Hair is normal.     Salivary Glands: Right salivary gland is not diffusely enlarged or tender. Left salivary gland is not diffusely enlarged or tender.     Comments: Right upper lip between nare and vermilion border is diffusely indurated, tender to palpation without erythema, punctate.  No internal or lesions were appreciated on exam, there is no skin, compromise relation on skin between lip and nose.      Mouth/Throat:     Lips: Pink.     Mouth: Mucous membranes are moist. No injury, lacerations, oral lesions or angioedema.     Dentition: Normal dentition. Does not have dentures. No dental  tenderness, gingival swelling, dental caries, dental abscesses or gum lesions.     Tongue: No lesions.  Tongue does not deviate from midline.     Palate: No mass and lesions.     Pharynx: Oropharynx is clear. Uvula midline. No pharyngeal swelling, oropharyngeal exudate, posterior oropharyngeal erythema or uvula swelling.     Tonsils: No tonsillar exudate or tonsillar abscesses. 0 on the right. 0 on the left.  Eyes:     General: Lids are normal.        Right eye: No discharge.        Left eye: No discharge.     Extraocular Movements: Extraocular movements intact.     Conjunctiva/sclera: Conjunctivae normal.     Right eye: Right conjunctiva is not injected.     Left eye: Left conjunctiva is not injected.  Neck:     Trachea: Trachea and phonation normal.  Cardiovascular:     Rate and Rhythm: Normal rate and regular rhythm.     Pulses: Normal pulses.     Heart sounds: Normal heart sounds. No murmur heard.   No friction rub. No gallop.  Pulmonary:     Effort: Pulmonary effort is normal. No accessory muscle usage, prolonged expiration or respiratory distress.     Breath sounds: Normal breath sounds. No stridor, decreased air movement or transmitted upper airway sounds. No decreased breath sounds, wheezing, rhonchi or rales.  Chest:     Chest wall: No tenderness.  Musculoskeletal:        General: Normal range of motion.     Cervical back: Normal range of motion and neck supple. Normal range of motion.  Lymphadenopathy:     Cervical: No cervical adenopathy.  Skin:    General: Skin is warm and dry.     Findings: No erythema or rash.  Neurological:     General: No focal deficit present.     Mental Status: She is alert and oriented to person, place, and time.  Psychiatric:        Mood and Affect: Mood normal.        Behavior: Behavior normal.    Visual Acuity Right Eye Distance:   Left Eye Distance:   Bilateral Distance:    Right Eye Near:   Left Eye Near:    Bilateral Near:      UC Couse / Diagnostics / Procedures:    EKG  Radiology No results found.  Procedures Procedures (including critical care time)  UC Diagnoses / Final Clinical Impressions(s)   I have reviewed the triage vital signs and the nursing notes.  Pertinent labs & imaging results that were available during my care of the patient were reviewed by me and considered in my medical decision making (see chart for details).   Final diagnoses:  Facial cellulitis   Facial cellulitis.  Patient provided with injection of Bicillin given proximity to the soft tissue, sinuses and brain.  Patient advised to follow-up with a 10-day course of Augmentin.  Patient advised to the emergency room she has not seen improvement of her symptoms.  ED Prescriptions     Medication Sig Dispense Auth. Provider   amoxicillin-clavulanate (AUGMENTIN) 875-125 MG tablet Take 1 tablet by mouth every 12 (twelve) hours. 14 tablet Lynden Oxford Scales, PA-C      PDMP not reviewed this encounter.  Pending results:  Labs Reviewed - No data to display  Medications Ordered in UC: Medications  penicillin g benzathine (BICILLIN LA) 1200000 UNIT/2ML injection 2.4 Million Units (2.4 Million Units Intramuscular Given 07/09/21 1326)    Disposition Upon Discharge:  Condition: stable for discharge home  Home: take medications as prescribed; routine discharge instructions as discussed; follow up as advised.  Patient presented with an acute illness with associated systemic symptoms and significant discomfort requiring urgent management. In my opinion, this is a condition that a prudent lay person (someone who possesses an average knowledge of health and medicine) may potentially expect to result in complications if not addressed urgently such as respiratory distress, impairment of bodily function or dysfunction of bodily organs.   Routine symptom specific, illness specific and/or disease specific instructions were discussed with the  patient and/or caregiver at length.   As such, the patient has been evaluated and assessed, work-up was performed and treatment was provided in alignment with urgent care protocols and evidence based medicine.  Patient/parent/caregiver has been advised that the patient may require follow up for further testing and treatment if the symptoms continue in spite of treatment, as clinically indicated and appropriate.  If the patient was tested for COVID-19, Influenza and/or RSV, then the patient/parent/guardian was advised to isolate at home pending the results of his/her diagnostic coronavirus test and potentially longer if theyre positive. I have also advised pt that if his/her COVID-19 test returns positive, it's recommended to self-isolate for at least 10 days after symptoms first appeared AND until fever-free for 24 hours without fever reducer AND other symptoms have improved or resolved. Discussed self-isolation recommendations as well as instructions for household member/close contacts as per the [facility] and [place] DHHS, and also gave patient the [facility] packet with this information.  Patient/parent/caregiver has been advised to return to the [facility] or PCP in 3-5 days if no better; to PCP or the Emergency Department if new signs and symptoms develop, or if the current signs or symptoms continue to change or worsen for further workup, evaluation and treatment as clinically indicated and appropriate  The patient will follow up with their current PCP if and as advised. If the patient does not currently have a PCP we will assist them in obtaining one.   The patient may need specialty follow up if the symptoms continue, in spite of conservative treatment and management, for further workup, evaluation, consultation and treatment as clinically indicated and appropriate.  Patient/parent/caregiver verbalized understanding and agreement of plan as discussed.  All questions were addressed during visit.  Please see discharge  instructions below for further details of plan.  Discharge Instructions:   Discharge Instructions      For the cellulitis in your upper lip, you are provided with an injection of Bicillin in the office today.  Please continue antibiotic therapy with Augmentin, 1 tablet twice daily for the next 7 days.  If you have not had complete resolution of your infection, swelling and tenderness in your face, please report to the emergency room for further evaluation, at that point you may require CT scan of your head to evaluate depth and extent of abscess IV antibiotic treatment.      This office note has been dictated using Museum/gallery curator.  Unfortunately, and despite my best efforts, this method of dictation can sometimes lead to occasional typographical or grammatical errors.  I apologize in advance if this occurs.     Lynden Oxford Scales, PA-C 07/09/21 1341

## 2021-07-09 NOTE — ED Triage Notes (Signed)
Pt presents with right side facial pain & facial swelling since yesterday.

## 2021-07-09 NOTE — Discharge Instructions (Addendum)
For the cellulitis in your upper lip, you are provided with an injection of Bicillin in the office today.  Please continue antibiotic therapy with Augmentin, 1 tablet twice daily for the next 7 days.  If you have not had complete resolution of your infection, swelling and tenderness in your face, please report to the emergency room for further evaluation, at that point you may require CT scan of your head to evaluate depth and extent of abscess IV antibiotic treatment.

## 2021-07-10 ENCOUNTER — Emergency Department (HOSPITAL_COMMUNITY): Payer: Medicaid Other

## 2021-07-10 ENCOUNTER — Ambulatory Visit (HOSPITAL_COMMUNITY): Payer: Self-pay

## 2021-07-10 ENCOUNTER — Emergency Department (HOSPITAL_COMMUNITY)
Admission: EM | Admit: 2021-07-10 | Discharge: 2021-07-10 | Disposition: A | Discharge status: Home / Self Care | Payer: Medicaid Other | Attending: Student | Admitting: Student

## 2021-07-10 DIAGNOSIS — F17200 Nicotine dependence, unspecified, uncomplicated: Secondary | ICD-10-CM | POA: Diagnosis not present

## 2021-07-10 DIAGNOSIS — Z79899 Other long term (current) drug therapy: Secondary | ICD-10-CM | POA: Diagnosis not present

## 2021-07-10 DIAGNOSIS — R220 Localized swelling, mass and lump, head: Secondary | ICD-10-CM | POA: Diagnosis present

## 2021-07-10 DIAGNOSIS — K047 Periapical abscess without sinus: Secondary | ICD-10-CM | POA: Diagnosis not present

## 2021-07-10 DIAGNOSIS — N9489 Other specified conditions associated with female genital organs and menstrual cycle: Secondary | ICD-10-CM | POA: Diagnosis not present

## 2021-07-10 LAB — CBC WITH DIFFERENTIAL/PLATELET
Abs Immature Granulocytes: 0.08 10*3/uL — ABNORMAL HIGH (ref 0.00–0.07)
Basophils Absolute: 0 10*3/uL (ref 0.0–0.1)
Basophils Relative: 0 %
Eosinophils Absolute: 0.3 10*3/uL (ref 0.0–0.5)
Eosinophils Relative: 2 %
HCT: 35.6 % — ABNORMAL LOW (ref 36.0–46.0)
Hemoglobin: 10.1 g/dL — ABNORMAL LOW (ref 12.0–15.0)
Immature Granulocytes: 1 %
Lymphocytes Relative: 12 %
Lymphs Abs: 1.4 10*3/uL (ref 0.7–4.0)
MCH: 18.4 pg — ABNORMAL LOW (ref 26.0–34.0)
MCHC: 28.4 g/dL — ABNORMAL LOW (ref 30.0–36.0)
MCV: 64.8 fL — ABNORMAL LOW (ref 80.0–100.0)
Monocytes Absolute: 0.9 10*3/uL (ref 0.1–1.0)
Monocytes Relative: 8 %
Neutro Abs: 8.6 10*3/uL — ABNORMAL HIGH (ref 1.7–7.7)
Neutrophils Relative %: 77 %
Platelets: 399 10*3/uL (ref 150–400)
RBC: 5.49 MIL/uL — ABNORMAL HIGH (ref 3.87–5.11)
RDW: 26.5 % — ABNORMAL HIGH (ref 11.5–15.5)
WBC: 11.2 10*3/uL — ABNORMAL HIGH (ref 4.0–10.5)
nRBC: 0 % (ref 0.0–0.2)

## 2021-07-10 LAB — BASIC METABOLIC PANEL
Anion gap: 8 (ref 5–15)
BUN: <5 mg/dL — ABNORMAL LOW (ref 6–20)
CO2: 24 mmol/L (ref 22–32)
Calcium: 9 mg/dL (ref 8.9–10.3)
Chloride: 105 mmol/L (ref 98–111)
Creatinine, Ser: 0.73 mg/dL (ref 0.44–1.00)
GFR, Estimated: >60 mL/min (ref >60)
Glucose, Bld: 99 mg/dL (ref 70–99)
Potassium: 3.6 mmol/L (ref 3.5–5.1)
Sodium: 137 mmol/L (ref 135–145)

## 2021-07-10 LAB — I-STAT BETA HCG BLOOD, ED (MC, WL, AP ONLY): I-stat hCG, quantitative: <5 m[IU]/mL (ref <5)

## 2021-07-10 MED ORDER — TETANUS-DIPHTH-ACELL PERTUSSIS 5-2.5-18.5 LF-MCG/0.5 IM SUSY: 0.5000 mL | PREFILLED_SYRINGE | Freq: Once | INTRAMUSCULAR | Status: DC

## 2021-07-10 MED ORDER — BENZOCAINE 20 % MT AERO
INHALATION_SPRAY | Freq: Once | OROMUCOSAL | Status: DC
  Filled 2021-07-10: qty 57

## 2021-07-10 MED ORDER — OXYCODONE-ACETAMINOPHEN 5-325 MG PO TABS: 1.0000 | ORAL_TABLET | Freq: Four times a day (QID) | ORAL | 0 refills | Status: DC | PRN

## 2021-07-10 MED ORDER — IOHEXOL 300 MG/ML  SOLN
100.0000 mL | Freq: Once | INTRAMUSCULAR | Status: AC | PRN
  Administered 2021-07-10: 100 mL via INTRAVENOUS

## 2021-07-10 NOTE — ED Provider Notes (Signed)
[facility] EMERGENCY DEPARTMENT Provider Note   CSN: [id number] Arrival date & time: 07/10/21  1026     History  Chief Complaint  Patient presents with   Facial Swelling    Mikayla Hansen is a 41 y.o. female with no pertinent past medical history who presents today for evaluation of swelling of her upper lip since Saturday.  She was seen at urgent care, given a steroid shot and a prescription for amoxicillin.  When she woke up this morning the swelling was worse and involve both sides of her lip.  She feels like it started by her nose slightly on the right side.  She denies any fevers and otherwise feels well.     HPI     Home Medications Prior to Admission medications   Medication Sig Start Date End Date Taking? Authorizing Provider  oxyCODONE-acetaminophen (PERCOCET/ROXICET) 5-325 MG tablet Take 1 tablet by mouth every 6 (six) hours as needed for severe pain. 07/10/21  Yes Lorin Glass, PA-C  albuterol (VENTOLIN HFA) 108 (90 Base) MCG/ACT inhaler Inhale 2 puffs into the lungs every 6 (six) hours as needed for wheezing.  06/21/14   [provider]  amLODipine (NORVASC) 5 MG tablet Take 2 tablets (10 mg total) by mouth daily. 03/10/19   Loura Halt A, NP  amoxicillin-clavulanate (AUGMENTIN) 875-125 MG tablet Take 1 tablet by mouth every 12 (twelve) hours. 07/09/21   Lynden Oxford Scales, PA-C  chlorthalidone (HYGROTON) 25 MG tablet Take 25 mg by mouth daily.    [provider]  FERROUS SULFATE PO Take 325 mg by mouth. Taking one PO every other day    [provider]  HYDROCHLOROTHIAZIDE PO Take 5 mg by mouth daily.    [provider]  meclizine (ANTIVERT) 25 MG tablet Take 1 tablet (25 mg total) by mouth 3 (three) times daily as needed for dizziness. 12/10/19   Vanessa Kick, MD  megestrol (MEGACE) 20 MG tablet Take 1 tablet (20 mg total) by mouth 2 (two) times daily. 04/12/21   Griffin Basil, MD      Allergies     Shellfish allergy, Bee venom, and Hydrocodone    Review of Systems   Review of Systems See above Physical Exam Updated Vital Signs BP (!) 159/115 (BP Location: Right Arm)    Pulse 85    Temp 99.3 F (37.4 C) (Oral)    Resp 19    SpO2 100%  Physical Exam Vitals and nursing note reviewed.  Constitutional:      General: She is not in acute distress.    Appearance: She is not ill-appearing or diaphoretic.  HENT:     Head: Atraumatic.     Nose: Nose normal. No congestion.     Mouth/Throat:     Comments: Obvious edema across the upper lip bilaterally.  There is a small sub cm swelling above the right medial incisor which is TTP and there is a palpable firmness in the right sided upper lip.  Eyes:     General: No scleral icterus.       Right eye: No discharge.        Left eye: No discharge.     Conjunctiva/sclera: Conjunctivae normal.  Cardiovascular:     Rate and Rhythm: Normal rate and regular rhythm.  Pulmonary:     Effort: Pulmonary effort is normal. No respiratory distress.     Breath sounds: No stridor.  Abdominal:     General: There is no distension.  Musculoskeletal:        General: No deformity.     Cervical back: Normal range of motion and neck supple.     Comments: No obvious acute injury  Skin:    General: Skin is warm and dry.  Neurological:     Mental Status: She is alert.     Motor: No abnormal muscle tone.     Comments: Awake and alert, answers all questions appropriately.  Speech is not slurred.  Psychiatric:        Mood and Affect: Mood normal.        Behavior: Behavior normal.    ED Results / Procedures / Treatments   Labs (all labs ordered are listed, but only abnormal results are displayed) Labs Reviewed  BASIC METABOLIC PANEL - Abnormal; Notable for the following components:      Result Value   BUN <5 (*)    All other components within normal limits  CBC WITH DIFFERENTIAL/PLATELET - Abnormal; Notable for the following components:   WBC 11.2 (*)     RBC 5.49 (*)    Hemoglobin 10.1 (*)    HCT 35.6 (*)    MCV 64.8 (*)    MCH 18.4 (*)    MCHC 28.4 (*)    RDW 26.5 (*)    Neutro Abs 8.6 (*)    Abs Immature Granulocytes 0.08 (*)    All other components within normal limits  I-STAT BETA HCG BLOOD, ED ([facility], [facility], [facility] ONLY)    EKG None  Radiology CT Maxillofacial W Contrast  Result Date: 07/10/2021 CLINICAL DATA:  Maxillary/facial abscess edema of the upper lip, patient feels started from inside the right nare EXAM: CT MAXILLOFACIAL WITH CONTRAST TECHNIQUE: Multidetector CT imaging of the maxillofacial structures was performed with intravenous contrast. Multiplanar CT image reconstructions were also generated. RADIATION DOSE REDUCTION: This exam was performed according to the departmental dose-optimization program which includes automated exposure control, adjustment of the mA and/or kV according to patient size and/or use of iterative reconstruction technique. CONTRAST:  159mL OMNIPAQUE IOHEXOL 300 MG/ML  SOLN COMPARISON:  None. FINDINGS: Osseous: Periapical lucency about the medial right maxillary incisor with disruption of the buccal cortex. Orbits: Unremarkable. Sinuses: No significant opacification. Soft tissues: Soft tissue swelling along the upper lip extending to the maxillary alveolus. There is a 1.1 x 1.2 x 1.1 cm low-density lesion contiguous with the disrupted maxillary alveolus described above. Limited intracranial: No abnormal enhancement. IMPRESSION: Periapical lucency about medial right maxillary incisor with disruption of buccal cortex and adjacent 1 cm abscess. Surrounding cellulitis. Electronically Signed   By: Macy Mis M.D.   On: 07/10/2021 14:35    Procedures .Marland KitchenIncision and Drainage  Date/Time: 07/10/2021 5:10 PM Performed by: Lorin Glass, PA-C Authorized by: Lorin Glass, PA-C   Consent:    Consent obtained:  Verbal   Consent given by:  Patient   Risks, benefits, and alternatives were discussed:  yes     Risks discussed:  Bleeding, incomplete drainage, pain, infection and damage to other organs (Need for additional procedures, spread of infection, damage to structures)   Alternatives discussed:  No treatment and alternative treatment Universal protocol:    Procedure explained and questions answered to patient or proxy's satisfaction: yes   Location:    Type:  Abscess   Location:  Mouth   Mouth location:  Alveolar process Anesthesia:    Anesthesia method:  Topical application   Topical anesthetic:  Benzocaine gel Procedure type:    Complexity:  Simple Procedure details:    Incision types:  Single straight   Incision depth:  Submucosal   Drainage:  Purulent and bloody (Gray purulent discharge)   Drainage amount:  Scant   Wound treatment:  Wound left open   Packing materials:  None Post-procedure details:    Procedure completion:  Tolerated well, no immediate complications    Medications Ordered in ED Medications  Benzocaine (HURRCAINE) 20 % mouth spray (has no administration in time range)  Tdap (BOOSTRIX) injection 0.5 mL (has no administration in time range)  iohexol (OMNIPAQUE) 300 MG/ML solution 100 mL (100 mLs Intravenous Contrast Given 07/10/21 1421)    ED Course/ Medical Decision Making/ A&P                           Patient presents today for evaluation of upper lip swelling.  She has started on Augmentin after being seen by urgent care yesterday.   This patient presents to the ED for concern of Lip swelling this involves an extensive number of treatment options, and is a complaint that carries with it a high risk of complications and morbidity.  The differential diagnosis includes abscess, cellulitis, allergic reaction, angioedema, dental infection   Lab Tests:  I Ordered, and personally interpreted labs.  The pertinent results include: Mild leukocyte gnosis with a white count of 11.2.  Mild anemia with a hemoglobin of 10.1.  Pregnancy test is  negative   Imaging Studies ordered:  I ordered imaging studies including CT max face with contrast I independently visualized and interpreted imaging which showed abscess associated with the right medial incisor    Medicines ordered and prescription drug management:  I ordered medication including benzocaine for procedural pain, Tdap booster Reevaluation of the patient after these medicines showed that the patient improved I have reviewed the patients home medicines and have made adjustments as needed   Critical Interventions:  Abscess incision and drainage    Problem List / ED Course:  Dental abscess   Reevaluation:  After the interventions noted above, I reevaluated the patient and found that they have :improved decreased pain, and edema   Social Determinants of Health:  Tobacco abuse   Dispostion:  After consideration of the diagnostic results and the patients response to treatment, I feel that the patent would benefit from discharge home with close outpatient dental follow and continuing her antibiotics  [facility] PMP is consulted, she is given a prescription for short course of narcotic pain medication.  She does have a history of palpitations to hydrocodone.  After risks, benefits, and alternatives are discussed she elected for trial of oxycodone.  She is informed that she may have a similar reaction and states her understanding.  Final Clinical Impression(s) / ED Diagnoses Final diagnoses:  Dental abscess    Rx / DC Orders ED Discharge Orders          Ordered    oxyCODONE-acetaminophen (PERCOCET/ROXICET) 5-325 MG tablet  Every 6 hours PRN        07/10/21 1731              Ollen Gross 07/10/21 1816    Teressa Lower, MD 07/10/21 2000

## 2021-07-10 NOTE — ED Triage Notes (Signed)
Pt with swelling above upper lip since Saturday morning. Seen at [facility] for same yesterday and given steroid shot and rx for amoxicillin and they told her it is an abscess. However she woke up this morning and the swelling is worse.

## 2021-07-10 NOTE — Discharge Instructions (Addendum)
Today your CT scan showed that you appear to have dental disease which caused your abscess. It is very important that you follow-up with a dentist or else this has a very high rate of recurring.  Please take Ibuprofen (Advil, motrin) and Tylenol (acetaminophen) to relieve your pain.    You may take up to 600 MG (3 pills) of normal strength ibuprofen every 8 hours as needed.   You make take tylenol, up to 1,000 mg (two extra strength pills) every 8 hours as needed.   It is safe to take ibuprofen and tylenol at the same time as they work differently.   Do not take more than 3,000 mg tylenol in a 24 hour period (not more than one dose every 8 hours.  Please check all medication labels as many medications such as pain and cold medications may contain tylenol.  Do not drink alcohol while taking these medications.  Do not take other NSAID'S while taking ibuprofen (such as aleve or naproxen).  Please take ibuprofen with food to decrease stomach upset.  You are being prescribed a medication which may make you sleepy. For 24 hours after one dose please do not drive, operate heavy machinery, care for a small child with out another adult present, or perform any activities that may cause harm to you or someone else if you were to fall asleep or be impaired.

## 2021-07-10 NOTE — ED Provider Triage Note (Signed)
Emergency Medicine Provider Triage Evaluation Note  Mikayla Hansen , a 41 y.o. female  was evaluated in triage.  Pt complains of pain and swelling of the face.  She reports that she was seen at urgent care today where they told her she had an abscess however when she woke up this morning she had swelling over her entire upper lip.  She denies any fevers.  She feels like she has something draining inside of her nose.  She denies any dental pain however does note that when she put her finger inside her mouth the other day she felt like she had a lump where her right central incisor gum met the bottom of her nose.   Physical Exam  BP (!) 146/101    Pulse 89    Temp 99.3 F (37.4 C) (Oral)    Resp 16    SpO2 100%  Gen:   Awake, no distress   Resp:  Normal effort  MSK:   Moves extremities without difficulty  Other:  There is obvious edema bilateral upper leg.  No discrete dental abscess identified.  No intranasal edema or abscess identified.  Normal phonation without elevation of the floor of the mouth or submandibular swelling.  Medical Decision Making  Medically screening exam initiated at 11:33 AM.  Appropriate orders placed.  CAMI DELAWDER was informed that the remainder of the evaluation will be completed by another provider, this initial triage assessment does not replace that evaluation, and the importance of remaining in the ED until their evaluation is complete.  Patient presents today for evaluation of swelling of the upper lip.  She does not take lisinopril.  She does take amlodipine but has not had it since Friday. On my exam she has obvious edema of the upper lip bilaterally, however I am not able to identify a discrete abscess.  Based on the progression of her swelling which limits exam we will check labs and obtain CT max face with contrast to evaluate for a drainable fluid collection versus superficial cellulitis.  Note: Portions of this report may have been transcribed using  voice recognition software. Every effort was made to ensure accuracy; however, inadvertent computerized transcription errors may be present    Lorin Glass, PA-C 07/10/21 1144

## 2021-07-12 ENCOUNTER — Other Ambulatory Visit: Payer: Self-pay

## 2021-07-12 ENCOUNTER — Emergency Department (HOSPITAL_BASED_OUTPATIENT_CLINIC_OR_DEPARTMENT_OTHER)
Admission: EM | Admit: 2021-07-12 | Discharge: 2021-07-12 | Disposition: A | Discharge status: Home / Self Care | Payer: Medicaid Other | Attending: Emergency Medicine | Admitting: Emergency Medicine

## 2021-07-12 ENCOUNTER — Encounter (HOSPITAL_BASED_OUTPATIENT_CLINIC_OR_DEPARTMENT_OTHER): Payer: Self-pay | Admitting: Emergency Medicine

## 2021-07-12 DIAGNOSIS — K047 Periapical abscess without sinus: Secondary | ICD-10-CM | POA: Insufficient documentation

## 2021-07-12 DIAGNOSIS — K0889 Other specified disorders of teeth and supporting structures: Secondary | ICD-10-CM | POA: Diagnosis present

## 2021-07-12 MED ORDER — LIDOCAINE HCL (PF) 1 % IJ SOLN
5.0000 mL | Freq: Once | INTRAMUSCULAR | Status: AC
  Administered 2021-07-12: 23:00:00 5 mL
  Filled 2021-07-12: qty 5

## 2021-07-12 NOTE — ED Triage Notes (Signed)
Presents for dental abscess since Saturday, has been evaluated at other facilities and been seen by a dentist. Was given augmentin, has been taking this but is noticing worsening of facial swelling rather than improvement. Denies trouble swallowing.

## 2021-07-12 NOTE — Discharge Instructions (Signed)
Continue the antibiotics.  Call the dentist if needed.  Return for worsening symptoms.  You potentially could use an oral surgeon also.

## 2021-07-12 NOTE — ED Provider Notes (Signed)
[place] [facility] EMERGENCY DEPT Provider Note   CSN: [id number] Arrival date & time: 07/12/21  2123     History  Chief Complaint  Patient presents with   Dental Pain    Mikayla Hansen is a 41 y.o. female.   Dental Pain Associated symptoms: facial swelling   Associated symptoms: no fever   Patient presents with dental abscess.  Has had since Saturday with today being Wednesday.  Has been seen in urgent care in the ER then dentist office yesterday.  States she is worried about the swelling her face.  States the swelling had improved after the incision and drainage.  Has been done within the ER and then again at dentist office.  States there was pus that came out.  States she is worried because of the swelling.  No difficulty swallowing.  No fevers.  Still has a few more days of Augmentin.    Home Medications Prior to Admission medications   Medication Sig Start Date End Date Taking? Authorizing Provider  albuterol (VENTOLIN HFA) 108 (90 Base) MCG/ACT inhaler Inhale 2 puffs into the lungs every 6 (six) hours as needed for wheezing.  06/21/14   [provider]  amLODipine (NORVASC) 5 MG tablet Take 2 tablets (10 mg total) by mouth daily. 03/10/19   Loura Halt A, NP  amoxicillin-clavulanate (AUGMENTIN) 875-125 MG tablet Take 1 tablet by mouth every 12 (twelve) hours. 07/09/21   Lynden Oxford Scales, PA-C  chlorthalidone (HYGROTON) 25 MG tablet Take 25 mg by mouth daily.    [provider]  FERROUS SULFATE PO Take 325 mg by mouth. Taking one PO every other day    [provider]  HYDROCHLOROTHIAZIDE PO Take 5 mg by mouth daily.    [provider]  meclizine (ANTIVERT) 25 MG tablet Take 1 tablet (25 mg total) by mouth 3 (three) times daily as needed for dizziness. 12/10/19   Vanessa Kick, MD  megestrol (MEGACE) 20 MG tablet Take 1 tablet (20 mg total) by mouth 2 (two) times daily. 04/12/21   Griffin Basil, MD  oxyCODONE-acetaminophen  (PERCOCET/ROXICET) 5-325 MG tablet Take 1 tablet by mouth every 6 (six) hours as needed for severe pain. 07/10/21   Lorin Glass, PA-C      Allergies    Shellfish allergy, Bee venom, and Hydrocodone    Review of Systems   Review of Systems  Constitutional:  Negative for appetite change, fatigue and fever.  HENT:  Positive for facial swelling. Negative for trouble swallowing.   Gastrointestinal:  Negative for abdominal pain.  Musculoskeletal:  Negative for back pain.  Skin:  Negative for wound.   Physical Exam Updated Vital Signs BP (!) 138/99    Pulse 93    Temp 99.2 F (37.3 C) (Oral)    Resp 18    Ht 5\' 4"  (1.626 m)    Wt 73.9 kg    SpO2 99%    BMI 27.98 kg/m  Physical Exam Vitals reviewed.  HENT:     Head:     Comments: Swelling of the upper lip below the nose.  Tenderness.  No fluctuance. Pulmonary:     Effort: Pulmonary effort is normal.  Abdominal:     Tenderness: There is no abdominal tenderness.  Musculoskeletal:        General: No tenderness.  Skin:    General: Skin is warm.     Capillary Refill: Capillary refill takes less than 2 seconds.  Neurological:     Mental Status:  She is alert and oriented to person, place, and time.    ED Results / Procedures / Treatments   Labs (all labs ordered are listed, but only abnormal results are displayed) Labs Reviewed - No data to display  EKG None  Radiology No results found.  Procedures Procedures    Medications Ordered in ED Medications  lidocaine (PF) (XYLOCAINE) 1 % injection 5 mL (5 mLs Infiltration Given by Other 07/12/21 2256)    ED Course/ Medical Decision Making/ A&P                           Medical Decision Making Amount and/or Complexity of Data Reviewed External Data Reviewed: radiology. Labs:  Decision-making details documented in ED Course. Radiology: independent interpretation performed. Decision-making details documented in ED Course.  Risk Prescription drug management. Decision  regarding hospitalization.   Patient with dental abscess.  Swelling in her upper lip.  Had been more swollen has been improving but still swollen.  Bedside ultrasound did show mass but not clear liquid ready for drainage.  Numbed up and a stab incision done with some bloody return but no frank purulence.  Swelling has improved from what it had been before.  Still has a few days of antibiotics less.  We will continue this.  States she is less swollen than she was previously.  We will follow dentist but that she would find an oral surgeon.  Does not appear to need new imaging.  No airway involvement.  Has seen dentistry already.  Will discharge home  Initial differential diagnosis includes return of abscess continued cellulitis allergic reaction from        Final Clinical Impression(s) / ED Diagnoses Final diagnoses:  Dental abscess    Rx / DC Orders ED Discharge Orders     None         Davonna Belling, MD 07/12/21 2325

## 2021-08-21 ENCOUNTER — Other Ambulatory Visit: Payer: Self-pay | Admitting: Physician Assistant

## 2021-08-21 DIAGNOSIS — D500 Iron deficiency anemia secondary to blood loss (chronic): Secondary | ICD-10-CM

## 2021-08-22 ENCOUNTER — Inpatient Hospital Stay: Payer: Medicaid Other | Attending: Physician Assistant

## 2021-08-22 ENCOUNTER — Inpatient Hospital Stay (HOSPITAL_BASED_OUTPATIENT_CLINIC_OR_DEPARTMENT_OTHER): Payer: Medicaid Other | Admitting: Physician Assistant

## 2021-08-22 ENCOUNTER — Telehealth: Payer: Self-pay

## 2021-08-22 ENCOUNTER — Other Ambulatory Visit: Payer: Self-pay

## 2021-08-22 VITALS — BP 167/109 | HR 83 | Temp 97.5°F | Resp 20 | Wt 172.7 lb

## 2021-08-22 DIAGNOSIS — D509 Iron deficiency anemia, unspecified: Secondary | ICD-10-CM | POA: Diagnosis not present

## 2021-08-22 DIAGNOSIS — K5900 Constipation, unspecified: Secondary | ICD-10-CM | POA: Insufficient documentation

## 2021-08-22 DIAGNOSIS — Z79899 Other long term (current) drug therapy: Secondary | ICD-10-CM | POA: Diagnosis not present

## 2021-08-22 DIAGNOSIS — D563 Thalassemia minor: Secondary | ICD-10-CM | POA: Diagnosis not present

## 2021-08-22 DIAGNOSIS — D500 Iron deficiency anemia secondary to blood loss (chronic): Secondary | ICD-10-CM

## 2021-08-22 LAB — CBC WITH DIFFERENTIAL (CANCER CENTER ONLY)
Abs Immature Granulocytes: 0.01 10*3/uL (ref 0.00–0.07)
Basophils Absolute: 0 10*3/uL (ref 0.0–0.1)
Basophils Relative: 1 %
Eosinophils Absolute: 0.2 10*3/uL (ref 0.0–0.5)
Eosinophils Relative: 3 %
HCT: 41.3 % (ref 36.0–46.0)
Hemoglobin: 13 g/dL (ref 12.0–15.0)
Immature Granulocytes: 0 %
Lymphocytes Relative: 25 %
Lymphs Abs: 1.3 10*3/uL (ref 0.7–4.0)
MCH: 21.9 pg — ABNORMAL LOW (ref 26.0–34.0)
MCHC: 31.5 g/dL (ref 30.0–36.0)
MCV: 69.6 fL — ABNORMAL LOW (ref 80.0–100.0)
Monocytes Absolute: 0.4 10*3/uL (ref 0.1–1.0)
Monocytes Relative: 8 %
Neutro Abs: 3.2 10*3/uL (ref 1.7–7.7)
Neutrophils Relative %: 63 %
Platelet Count: 276 10*3/uL (ref 150–400)
RBC: 5.93 MIL/uL — ABNORMAL HIGH (ref 3.87–5.11)
RDW: 26.4 % — ABNORMAL HIGH (ref 11.5–15.5)
WBC Count: 5.1 10*3/uL (ref 4.0–10.5)
nRBC: 0 % (ref 0.0–0.2)

## 2021-08-22 LAB — IRON AND IRON BINDING CAPACITY (CC-WL,HP ONLY)
Iron: 121 ug/dL (ref 28–170)
Saturation Ratios: 34 % — ABNORMAL HIGH (ref 10.4–31.8)
TIBC: 361 ug/dL (ref 250–450)
UIBC: 240 ug/dL (ref 148–442)

## 2021-08-22 LAB — FERRITIN: Ferritin: 30 ng/mL (ref 11–307)

## 2021-08-22 NOTE — Progress Notes (Signed)
[place] [phone]   [phone]  PROGRESS NOTE  Patient Care Team: Berkley Harvey, NP as PCP - General (Nurse Practitioner)  Hematological/Oncological History 1) Iron deficiency anemia -08/03/2020: WBC 4.8, Hgb 9.8, MCV 61.1, Plt 20.7, Ferritin 5 -08/19/2020: WBC 6.2. 10.1, MCV 60.9, Plt 412 -09/26/2020: WBC 6.5, Hgb 11.2, MCV 64.1, Plt 340, Ferritin 6.4 -12/28/2020: WBC 7.2, Hgb 11.1, MCV 67.5, Plt 336, Ferritin 5.2 -06/05/2021: WBC 6.8, Hgb 9.2, MCV 59.4, Plt 502, Ferritin 4.2  2) 06/27/2021: Establish care at [facility] Hematology[facility]Oncology  3) 07/03/2021: Received IV Monoferric 1000 mg x 1   CHIEF COMPLAINTS/PURPOSE OF CONSULTATION:  Iron deficiency anemia  HISTORY OF PRESENTING ILLNESS:  Mikayla Hansen 41 y.o. female returns for follow-up for iron deficiency anemia.  She is unaccompanied for this visit.  Her last visit was on 06/27/2021 and in the interim she has received IV iron infusion.   On exam today, [name] reports she levels have markedly improved since receiving IV iron.  She is more active and completes all her daily activities on her own.  She has a good appetite and is incorporating iron rich foods into her diet.  She is compliant with taking iron pills once a day.  She denies any nausea or vomiting.  Patient reports intermittent episodes of constipation with a bowel movement once every 2 to 3 days.  She continues to have heavy menstrual bleeding and is scheduled to have a follow-up with her OB/GYN next week to discuss interventions.  She denies any fevers, chills, night sweats, shortness of breath, chest pain or cough. She has no other complaints. Rest of the 10 point ROS is below.   Past Medical History:  Diagnosis Date   Abnormal Pap smear    Asthma    Bacterial vaginosis 09-07-2011   Glaucoma    Gonorrhea 2007   Hypertension    Infection     Past Surgical History:  Procedure Laterality Date   TUBAL LIGATION  2004    WISDOM TOOTH EXTRACTION  2009    Family History  Problem Relation Age of Onset   Hypertension Mother    Seizures Mother    Cancer Paternal Grandfather    Anesthesia problems Neg Hx     Social History   Socioeconomic History   Marital status: Single    Spouse name: Not on file   Number of children: Not on file   Years of education: Not on file   Highest education level: Not on file  Occupational History   Not on file  Tobacco Use   Smoking status: Every Day    Packs/day: 1.00    Years: 20.00    Pack years: 20.00    Types: Cigarettes   Smokeless tobacco: Never   Tobacco comments:    Trying to quit and now smoking 1 cigarette a day. On nicotine patch.   Vaping Use   Vaping Use: Never used  Substance and Sexual Activity   Alcohol use: Yes    Alcohol/week: 0.0 standard drinks    Comment: occ   Drug use: No   Sexual activity: Yes    Partners: Male    Birth control/protection: Surgical  Other Topics Concern   Not on file  Social History Narrative   Lives with 4 children and husband (married for 17 years). 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   Social Determinants of Health   Financial Resource Strain: Not on file  Food Insecurity: Not  on file  Transportation Needs: Not on file  Physical Activity: Not on file  Stress: Not on file  Social Connections: Not on file  Intimate Partner Violence: Not on file    Outpatient Medications Prior to Visit  Medication Sig Dispense Refill   albuterol (VENTOLIN HFA) 108 (90 Base) MCG/ACT inhaler Inhale 2 puffs into the lungs every 6 (six) hours as needed for wheezing.      amLODipine (NORVASC) 5 MG tablet Take 2 tablets (10 mg total) by mouth daily. 30 tablet 1   chlorthalidone (HYGROTON) 25 MG tablet Take 25 mg by mouth daily.     FERROUS SULFATE PO Take 325 mg by mouth. Taking one PO every other day     HYDROCHLOROTHIAZIDE PO Take 5 mg by mouth daily.     meclizine (ANTIVERT) 25 MG tablet Take 1 tablet (25 mg total) by  mouth 3 (three) times daily as needed for dizziness. 30 tablet 1   amoxicillin-clavulanate (AUGMENTIN) 875-125 MG tablet Take 1 tablet by mouth every 12 (twelve) hours. (Patient not taking: Reported on 08/22/2021) 14 tablet 0   megestrol (MEGACE) 20 MG tablet Take 1 tablet (20 mg total) by mouth 2 (two) times daily. (Patient not taking: Reported on 08/22/2021) 60 tablet 5   oxyCODONE-acetaminophen (PERCOCET/ROXICET) 5-325 MG tablet Take 1 tablet by mouth every 6 (six) hours as needed for severe pain. (Patient not taking: Reported on 08/22/2021) 5 tablet 0   No facility-administered medications prior to visit.    Allergies  Allergen Reactions   Shellfish Allergy Anaphylaxis   Bee Venom Hives and Swelling   Hydrocodone Palpitations    ROS Review of Systems  Constitutional:  Negative for appetite change, chills, diaphoresis, fatigue, fever and unexpected weight change.  HENT:  Negative for congestion and sinus pressure.   Eyes:  Negative for visual disturbance.  Respiratory:  Negative for cough, chest tightness, shortness of breath and wheezing.   Cardiovascular:  Negative for chest pain and leg swelling.  Gastrointestinal:  Positive for constipation. Negative for abdominal pain, blood in stool, diarrhea, nausea and vomiting.  Skin:  Negative for pallor and rash.  Neurological:  Negative for dizziness, light-headedness, numbness and headaches.     Objective:    Physical Exam Constitutional:      Appearance: Normal appearance. She is not ill-appearing.  HENT:     Head: Normocephalic.  Eyes:     General: No scleral icterus.    Extraocular Movements: Extraocular movements intact.     Conjunctiva/sclera: Conjunctivae normal.  Cardiovascular:     Rate and Rhythm: Normal rate and regular rhythm.  Pulmonary:     Effort: Pulmonary effort is normal.     Breath sounds: Normal breath sounds.  Musculoskeletal:        General: No swelling or tenderness.  Skin:    General: Skin is warm.   Neurological:     General: No focal deficit present.     Mental Status: She is alert and oriented to person, place, and time.  Psychiatric:        Mood and Affect: Mood normal.    BP (!) 167/109    Pulse 83    Temp (!) 97.5 F (36.4 C)    Resp 20    Wt 172 lb 11.2 oz (78.3 kg)    SpO2 100%    BMI 29.64 kg/m  Wt Readings from Last 3 Encounters:  08/22/21 172 lb 11.2 oz (78.3 kg)  07/12/21 163 lb (73.9 kg)  07/03/21 168  lb 6.4 oz (76.4 kg)     Health Maintenance Due  Topic Date Due   COVID-19 Vaccine (1) Never done   INFLUENZA VACCINE  01/16/2021    There are no preventive care reminders to display for this patient.  Lab Results  Component Value Date   TSH 1.564 04/28/2015   Lab Results  Component Value Date   WBC 5.1 08/22/2021   HGB 13.0 08/22/2021   HCT 41.3 08/22/2021   MCV 69.6 (L) 08/22/2021   PLT 276 08/22/2021   Lab Results  Component Value Date   NA 137 07/10/2021   K 3.6 07/10/2021   CO2 24 07/10/2021   GLUCOSE 99 07/10/2021   BUN <5 (L) 07/10/2021   CREATININE 0.73 07/10/2021   BILITOT 0.5 06/27/2021   ALKPHOS 51 06/27/2021   AST 19 06/27/2021   ALT 12 06/27/2021   PROT 7.5 06/27/2021   ALBUMIN 4.2 06/27/2021   CALCIUM 9.0 07/10/2021   ANIONGAP 8 07/10/2021   Lab Results  Component Value Date   CHOL 181 04/28/2015   Lab Results  Component Value Date   HDL 50 04/28/2015   Lab Results  Component Value Date   LDLCALC 100 (H) 09/07/2011   Lab Results  Component Value Date   TRIG 65 04/28/2015   Lab Results  Component Value Date   CHOLHDL 3.8 09/07/2011   No results found for: HGBA1C    Assessment & Plan:   Mikayla Hansen is a 41 y.o. female who returns for follow-up for iron deficiency anemia.  #Iron deficiency anemia 2/2 menstrual bleeding: --Received IV Monoferric 1000 mg x 1 dose on 07/03/2021 --Labs today show that anemia has resolved with Hgb of 13.0. Iron panel shows iron 121, TIBC 361, saturation 34% and ferritin 30.   --Continue to incorporate iron rich foods into her diet --Continue ferrous sulfate 325 mg once every day and take stool softeners daily to minimize constipation.  --Patient is scheduled for follow-up with her OB/GYN next week to discuss interventions to minimize menstrual bleeding. --RTC in 3 months with repeat labs.   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  --Continue to monitor.   #Constipation and lower abdominal pain with bowel movements: --Sent referral to [facility] for further evaluation, patient plans to schedule a follow-up  #Age appropriate screenings:  -- Last mammogram was on 06/26/2021 that showed no evidence of malignancy. --Patient currently smokes and trying to quit. Has 20 pack year history and would be eligible for low dose CT scan for lung cancer screening at age 38.  --GI team will determine if she needs colonoscopy sooner than recommended (45 years). --Patient underwent PAP smear in October 2022 that shows no evidence of malignancy.    All questions were answered. The patient knows to call the clinic with any problems, questions or concerns.  I have spent a total of 25 minutes minutes of face-to-face and non-face-to-face time, preparing to see the patient, performing a medically appropriate examination, counseling and educating the patient, ordering tests, documenting clinical information in the electronic health record, and care coordination.    Dede Query, PA-C Department of Hematology/Oncology [place] at [facility] Phone: [phone]

## 2021-08-22 NOTE — Telephone Encounter (Signed)
Pt advised with VU 

## 2021-08-22 NOTE — Telephone Encounter (Signed)
-----   Message from Lincoln Brigham, PA-C sent at 08/22/2021 11:48 AM EST ----- ?Please notify patient that iron panel shows no evidence of deficiency. Continue iron pills and we will see her back in 3 months ? ?

## 2021-08-23 ENCOUNTER — Telehealth: Payer: Self-pay | Admitting: Physician Assistant

## 2021-08-23 NOTE — Telephone Encounter (Signed)
Scheduled per 3/7 los, message has been left with pt ?

## 2021-08-28 ENCOUNTER — Other Ambulatory Visit: Payer: Self-pay | Admitting: Pharmacy Technician

## 2021-11-21 ENCOUNTER — Other Ambulatory Visit: Payer: Self-pay | Admitting: Physician Assistant

## 2021-11-21 DIAGNOSIS — D500 Iron deficiency anemia secondary to blood loss (chronic): Secondary | ICD-10-CM

## 2021-11-22 ENCOUNTER — Inpatient Hospital Stay: Payer: Medicaid Other | Admitting: Physician Assistant

## 2021-11-22 ENCOUNTER — Inpatient Hospital Stay: Payer: Medicaid Other

## 2021-11-27 ENCOUNTER — Other Ambulatory Visit: Payer: Self-pay | Admitting: Physician Assistant

## 2021-11-28 ENCOUNTER — Inpatient Hospital Stay (HOSPITAL_BASED_OUTPATIENT_CLINIC_OR_DEPARTMENT_OTHER): Payer: Medicaid Other | Admitting: Physician Assistant

## 2021-11-28 ENCOUNTER — Inpatient Hospital Stay: Payer: Medicaid Other | Attending: Physician Assistant

## 2021-11-28 ENCOUNTER — Other Ambulatory Visit: Payer: Self-pay

## 2021-11-28 VITALS — BP 137/102 | HR 75 | Temp 98.3°F | Resp 15 | Wt 185.7 lb

## 2021-11-28 DIAGNOSIS — D563 Thalassemia minor: Secondary | ICD-10-CM | POA: Diagnosis not present

## 2021-11-28 DIAGNOSIS — K5900 Constipation, unspecified: Secondary | ICD-10-CM | POA: Insufficient documentation

## 2021-11-28 DIAGNOSIS — R1030 Lower abdominal pain, unspecified: Secondary | ICD-10-CM | POA: Insufficient documentation

## 2021-11-28 DIAGNOSIS — D500 Iron deficiency anemia secondary to blood loss (chronic): Secondary | ICD-10-CM

## 2021-11-28 DIAGNOSIS — N920 Excessive and frequent menstruation with regular cycle: Secondary | ICD-10-CM | POA: Insufficient documentation

## 2021-11-28 DIAGNOSIS — Z79899 Other long term (current) drug therapy: Secondary | ICD-10-CM | POA: Insufficient documentation

## 2021-11-28 LAB — CMP (CANCER CENTER ONLY)
ALT: 12 U/L (ref 0–44)
AST: 15 U/L (ref 15–41)
Albumin: 4.1 g/dL (ref 3.5–5.0)
Alkaline Phosphatase: 61 U/L (ref 38–126)
Anion gap: 3 — ABNORMAL LOW (ref 5–15)
BUN: 7 mg/dL (ref 6–20)
CO2: 29 mmol/L (ref 22–32)
Calcium: 9.2 mg/dL (ref 8.9–10.3)
Chloride: 104 mmol/L (ref 98–111)
Creatinine: 0.69 mg/dL (ref 0.44–1.00)
GFR, Estimated: >60 mL/min (ref >60)
Glucose, Bld: 115 mg/dL — ABNORMAL HIGH (ref 70–99)
Potassium: 3.8 mmol/L (ref 3.5–5.1)
Sodium: 136 mmol/L (ref 135–145)
Total Bilirubin: 0.4 mg/dL (ref 0.3–1.2)
Total Protein: 7.3 g/dL (ref 6.5–8.1)

## 2021-11-28 LAB — CBC WITH DIFFERENTIAL (CANCER CENTER ONLY)
Abs Immature Granulocytes: 0.02 10*3/uL (ref 0.00–0.07)
Basophils Absolute: 0 10*3/uL (ref 0.0–0.1)
Basophils Relative: 0 %
Eosinophils Absolute: 0.3 10*3/uL (ref 0.0–0.5)
Eosinophils Relative: 4 %
HCT: 39.9 % (ref 36.0–46.0)
Hemoglobin: 13 g/dL (ref 12.0–15.0)
Immature Granulocytes: 0 %
Lymphocytes Relative: 14 %
Lymphs Abs: 0.9 10*3/uL (ref 0.7–4.0)
MCH: 24 pg — ABNORMAL LOW (ref 26.0–34.0)
MCHC: 32.6 g/dL (ref 30.0–36.0)
MCV: 73.6 fL — ABNORMAL LOW (ref 80.0–100.0)
Monocytes Absolute: 0.6 10*3/uL (ref 0.1–1.0)
Monocytes Relative: 9 %
Neutro Abs: 4.5 10*3/uL (ref 1.7–7.7)
Neutrophils Relative %: 73 %
Platelet Count: 354 10*3/uL (ref 150–400)
RBC: 5.42 MIL/uL — ABNORMAL HIGH (ref 3.87–5.11)
RDW: 15.2 % (ref 11.5–15.5)
WBC Count: 6.3 10*3/uL (ref 4.0–10.5)
nRBC: 0 % (ref 0.0–0.2)

## 2021-11-28 LAB — FERRITIN: Ferritin: 7 ng/mL — ABNORMAL LOW (ref 11–307)

## 2021-11-28 LAB — IRON AND IRON BINDING CAPACITY (CC-WL,HP ONLY)
Iron: 51 ug/dL (ref 28–170)
Saturation Ratios: 12 % (ref 10.4–31.8)
TIBC: 440 ug/dL (ref 250–450)
UIBC: 389 ug/dL (ref 148–442)

## 2021-11-28 NOTE — Progress Notes (Signed)
[place] [phone]   [phone]  PROGRESS NOTE  Patient Care Team: Berkley Harvey, NP as PCP - General (Nurse Practitioner)  Hematological/Oncological History 1) Iron deficiency anemia -08/03/2020: WBC 4.8, Hgb 9.8, MCV 61.1, Plt 20.7, Ferritin 5 -08/19/2020: WBC 6.2. 10.1, MCV 60.9, Plt 412 -09/26/2020: WBC 6.5, Hgb 11.2, MCV 64.1, Plt 340, Ferritin 6.4 -12/28/2020: WBC 7.2, Hgb 11.1, MCV 67.5, Plt 336, Ferritin 5.2 -06/05/2021: WBC 6.8, Hgb 9.2, MCV 59.4, Plt 502, Ferritin 4.2  2) 06/27/2021: Establish care at [facility] Hematology/Oncology  3) 07/03/2021: Received IV Monoferric 1000 mg x 1   CHIEF COMPLAINTS/PURPOSE OF CONSULTATION:  Iron deficiency anemia  HISTORY OF PRESENTING ILLNESS:  Mikayla Hansen 41 y.o. female returns for follow-up for iron deficiency anemia.  She is unaccompanied for this visit.  Her last visit was on 08/22/2021 and in the interim she continues on iron pills.   On exam today, Mikayla Hansen reports her energy levels are stable. She is able to complete all her daily activities on her own. Her appetite and weight are unchanged. She denies nausea, vomiting or abdominal pain. Her bowel habits are unchanged without any recurrent episodes of diarrhea or constipation. She continues to have heavy menstrual bleeding and adds that she is in discussion with her OB/GYN to undergo a hysterectomy.  She denies any fevers, chills, night sweats, shortness of breath, chest pain or cough. She has no other complaints. Rest of the 10 point ROS is below.   Past Medical History:  Diagnosis Date   Abnormal Pap smear    Asthma    Bacterial vaginosis 09-07-2011   Glaucoma    Gonorrhea 2007   Hypertension    Infection     Past Surgical History:  Procedure Laterality Date   TUBAL LIGATION  2004   WISDOM TOOTH EXTRACTION  2009    Family History  Problem Relation Age of Onset   Hypertension Mother    Seizures Mother    Cancer Paternal  Grandfather    Anesthesia problems Neg Hx     Social History   Socioeconomic History   Marital status: Single    Spouse name: Not on file   Number of children: Not on file   Years of education: Not on file   Highest education level: Not on file  Occupational History   Not on file  Tobacco Use   Smoking status: Every Day    Packs/day: 1.00    Years: 20.00    Total pack years: 20.00    Types: Cigarettes   Smokeless tobacco: Never   Tobacco comments:    Trying to quit and now smoking 1 cigarette a day. On nicotine patch.   Vaping Use   Vaping Use: Never used  Substance and Sexual Activity   Alcohol use: Yes    Alcohol/week: 0.0 standard drinks of alcohol    Comment: occ   Drug use: No   Sexual activity: Yes    Partners: Male    Birth control/protection: Surgical  Other Topics Concern   Not on file  Social History Narrative   Lives with 4 children and husband (married for 17 years). 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   Social Determinants of Health   Financial Resource Strain: Not on file  Food Insecurity: Not on file  Transportation Needs: Not on file  Physical Activity: Not on file  Stress: Not on file  Social Connections: Not on file  Intimate Partner Violence: Not  on file    Outpatient Medications Prior to Visit  Medication Sig Dispense Refill   albuterol (VENTOLIN HFA) 108 (90 Base) MCG/ACT inhaler Inhale 2 puffs into the lungs every 6 (six) hours as needed for wheezing.      amLODipine (NORVASC) 5 MG tablet Take 2 tablets (10 mg total) by mouth daily. 30 tablet 1   chlorthalidone (HYGROTON) 25 MG tablet Take 25 mg by mouth daily.     FERROUS SULFATE PO Take 325 mg by mouth. Taking one PO every other day     HYDROCHLOROTHIAZIDE PO Take 5 mg by mouth daily.     meclizine (ANTIVERT) 25 MG tablet Take 1 tablet (25 mg total) by mouth 3 (three) times daily as needed for dizziness. 30 tablet 1   No facility-administered medications prior to visit.     Allergies  Allergen Reactions   Shellfish Allergy Anaphylaxis   Bee Venom Hives and Swelling   Hydrocodone Palpitations    ROS Review of Systems  Constitutional:  Negative for appetite change, chills, diaphoresis, fatigue, fever and unexpected weight change.  HENT:  Negative for congestion and sinus pressure.   Eyes:  Negative for visual disturbance.  Respiratory:  Negative for cough, chest tightness, shortness of breath and wheezing.   Cardiovascular:  Negative for chest pain and leg swelling.  Gastrointestinal:  Negative for abdominal pain, blood in stool, constipation, diarrhea, nausea and vomiting.  Skin:  Negative for pallor and rash.  Neurological:  Negative for dizziness, light-headedness, numbness and headaches.      Objective:    Physical Exam Constitutional:      Appearance: Normal appearance. She is not ill-appearing.  HENT:     Head: Normocephalic.  Eyes:     General: No scleral icterus.    Extraocular Movements: Extraocular movements intact.     Conjunctiva/sclera: Conjunctivae normal.  Cardiovascular:     Rate and Rhythm: Normal rate and regular rhythm.  Pulmonary:     Effort: Pulmonary effort is normal.     Breath sounds: Normal breath sounds.  Musculoskeletal:        General: No swelling or tenderness.  Skin:    General: Skin is warm.  Neurological:     General: No focal deficit present.     Mental Status: She is alert and oriented to person, place, and time.  Psychiatric:        Mood and Affect: Mood normal.    BP (!) 137/102 (BP Location: Right Arm, Patient Position: Sitting) Comment: Nurse was notify  Pulse 75   Temp 98.3 F (36.8 C) (Oral)   Resp 15   Wt 185 lb 11.2 oz (84.2 kg)   SpO2 100%   BMI 31.88 kg/m  Wt Readings from Last 3 Encounters:  11/28/21 185 lb 11.2 oz (84.2 kg)  08/22/21 172 lb 11.2 oz (78.3 kg)  07/12/21 163 lb (73.9 kg)     Health Maintenance Due  Topic Date Due   COVID-19 Vaccine (1) Never done    TETANUS/TDAP  09/06/2021    There are no preventive care reminders to display for this patient.  Lab Results  Component Value Date   TSH 1.564 04/28/2015   Lab Results  Component Value Date   WBC 6.3 11/28/2021   HGB 13.0 11/28/2021   HCT 39.9 11/28/2021   MCV 73.6 (L) 11/28/2021   PLT 354 11/28/2021   Lab Results  Component Value Date   NA 136 11/28/2021   K 3.8 11/28/2021   CO2 29  11/28/2021   GLUCOSE 115 (H) 11/28/2021   BUN 7 11/28/2021   CREATININE 0.69 11/28/2021   BILITOT 0.4 11/28/2021   ALKPHOS 61 11/28/2021   AST 15 11/28/2021   ALT 12 11/28/2021   PROT 7.3 11/28/2021   ALBUMIN 4.1 11/28/2021   CALCIUM 9.2 11/28/2021   ANIONGAP 3 (L) 11/28/2021   Lab Results  Component Value Date   CHOL 181 04/28/2015   Lab Results  Component Value Date   HDL 50 04/28/2015   Lab Results  Component Value Date   LDLCALC 100 (H) 09/07/2011   Lab Results  Component Value Date   TRIG 65 04/28/2015   Lab Results  Component Value Date   CHOLHDL 3.8 09/07/2011   No results found for: "HGBA1C"    Assessment & Plan:   Mikayla Hansen is a 41 y.o. female who returns for follow-up for iron deficiency anemia.  #Iron deficiency anemia 2/2 menstrual bleeding: --Received IV Monoferric 1000 mg x 1 dose on 07/03/2021 --Labs today show no evidence of anemia with Hgb of 13.0. Iron panel is consistent with deficiency iron 51, TIBC 4401, saturation 12% and ferritin 7 .  --Continue to incorporate iron rich foods into her diet --Continue ferrous sulfate 325 mg once every day and take stool softeners daily to minimize constipation.  --Continue to follow-up with her OB/GYN to discuss interventions to minimize menstrual bleeding. --Recommend another round of IV monoferric to help bolster iron levels.  --RTC in 3 months with repeat labs.   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  --Continue to monitor.    #Constipation and lower abdominal pain with bowel movements: --Sent referral to [facility] for further evaluation, patient plans to schedule a follow-up  #Age appropriate screenings:  -- Last mammogram was on 06/26/2021 that showed no evidence of malignancy. --Patient currently smokes and trying to quit. Has 20 pack year history and would be eligible for low dose CT scan for lung cancer screening at age 33.  --GI team will determine if she needs colonoscopy sooner than recommended (45 years). --Patient underwent PAP smear in October 2022 that shows no evidence of malignancy.    All questions were answered. The patient knows to call the clinic with any problems, questions or concerns.  I have spent a total of 25 minutes minutes of face-to-face and non-face-to-face time, preparing to see the patient, performing a medically appropriate examination, counseling and educating the patient, ordering meds/tests, documenting clinical information in the electronic health record, and care coordination.    Dede Query, PA-C Department of Hematology/Oncology [place] [facility] Phone: [phone]

## 2021-11-29 ENCOUNTER — Telehealth: Payer: Self-pay | Admitting: Physician Assistant

## 2021-11-29 ENCOUNTER — Encounter: Payer: Self-pay | Admitting: Physician Assistant

## 2021-11-29 ENCOUNTER — Telehealth: Payer: Self-pay | Admitting: Pharmacy Technician

## 2021-11-29 ENCOUNTER — Telehealth: Payer: Self-pay

## 2021-11-29 NOTE — Telephone Encounter (Signed)
Pt advised and verbalized understanding.

## 2021-11-29 NOTE — Telephone Encounter (Signed)
-----   Message from Lincoln Brigham, PA-C sent at 11/28/2021  4:39 PM EDT ----- Please notify patient that iron levels are worsening and require IV iron. We will arrange IV iron at [facility].   Thanks, Murray Hodgkins  ----- Message ----- From: Buel Ream, [facility] [place] Sent: 11/28/2021  10:05 AM EDT To: Lincoln Brigham, PA-C

## 2021-11-29 NOTE — Telephone Encounter (Signed)
Scheduled per 6/13 los, pt has been called and confirmed

## 2021-11-29 NOTE — Telephone Encounter (Signed)
Dr. Charlies Silvers, [facility]ier NOTE:  Auth Submission: no auth needed Payer: [employer] MEDICAID Medication & CPT/J Code(s) submitted: MONOFERRIC J1437 Route of submission (phone, fax, portal): PHONE: [phone] Auth type: Buy/Bill Units/visits requested: X1 DOSE Reference number: [id number] Approval from: 11/29/21 to 03/01/22

## 2021-12-06 ENCOUNTER — Ambulatory Visit (INDEPENDENT_AMBULATORY_CARE_PROVIDER_SITE_OTHER): Payer: Medicaid Other

## 2021-12-06 VITALS — BP 145/88 | HR 72 | Temp 98.2°F | Resp 18 | Ht 64.0 in | Wt 184.6 lb

## 2021-12-06 DIAGNOSIS — D500 Iron deficiency anemia secondary to blood loss (chronic): Secondary | ICD-10-CM | POA: Diagnosis not present

## 2021-12-06 MED ORDER — SODIUM CHLORIDE 0.9 % IV SOLN
1000.0000 mg | Freq: Once | INTRAVENOUS | Status: AC
  Administered 2021-12-06: 1000 mg via INTRAVENOUS
  Filled 2021-12-06: qty 10

## 2021-12-06 NOTE — Progress Notes (Signed)
Diagnosis: Iron Deficiency Anemia  Provider:  Marshell Garfinkel, MD  Procedure: Infusion  IV Type: Peripheral, IV Location: L Antecubital  Monoferric (Ferric Derisomaltose), Dose: 1000 mg  Infusion Start Time: 0915  Infusion Stop Time: 0940  Post Infusion IV Care: Peripheral IV Discontinued  Discharge: Condition: Good, Destination: Home . AVS provided to patient.   Performed by:  Koren Shiver, RN

## 2022-02-27 ENCOUNTER — Other Ambulatory Visit: Payer: Self-pay

## 2022-02-27 DIAGNOSIS — D500 Iron deficiency anemia secondary to blood loss (chronic): Secondary | ICD-10-CM

## 2022-02-28 ENCOUNTER — Inpatient Hospital Stay: Payer: Medicaid Other

## 2022-02-28 ENCOUNTER — Inpatient Hospital Stay: Payer: Medicaid Other | Admitting: Physician Assistant

## 2022-03-06 ENCOUNTER — Inpatient Hospital Stay: Payer: Medicaid Other | Attending: Physician Assistant

## 2022-03-06 ENCOUNTER — Inpatient Hospital Stay: Payer: Medicaid Other | Admitting: Physician Assistant

## 2022-09-11 ENCOUNTER — Emergency Department (HOSPITAL_COMMUNITY): Payer: Self-pay

## 2022-09-11 ENCOUNTER — Emergency Department (HOSPITAL_COMMUNITY)
Admission: EM | Admit: 2022-09-11 | Discharge: 2022-09-11 | Disposition: A | Discharge status: Home / Self Care | Payer: Self-pay | Attending: Emergency Medicine | Admitting: Emergency Medicine

## 2022-09-11 DIAGNOSIS — X501XXA Overexertion from prolonged static or awkward postures, initial encounter: Secondary | ICD-10-CM | POA: Insufficient documentation

## 2022-09-11 DIAGNOSIS — T148XXA Other injury of unspecified body region, initial encounter: Secondary | ICD-10-CM

## 2022-09-11 DIAGNOSIS — S39012A Strain of muscle, fascia and tendon of lower back, initial encounter: Secondary | ICD-10-CM | POA: Insufficient documentation

## 2022-09-11 MED ORDER — METHOCARBAMOL 500 MG PO TABS: 500.0000 mg | ORAL_TABLET | Freq: Two times a day (BID) | ORAL | 0 refills | Status: AC

## 2022-09-11 MED ORDER — MORPHINE SULFATE (PF) 2 MG/ML IV SOLN
2.0000 mg | Freq: Once | INTRAVENOUS | Status: DC
  Filled 2022-09-11: qty 1

## 2022-09-11 MED ORDER — LIDOCAINE 5 % EX PTCH: 1.0000 | MEDICATED_PATCH | CUTANEOUS | 0 refills | Status: AC

## 2022-09-11 MED ORDER — MORPHINE SULFATE (PF) 2 MG/ML IV SOLN
2.0000 mg | Freq: Once | INTRAVENOUS | Status: AC
  Administered 2022-09-11: 2 mg via INTRAMUSCULAR

## 2022-09-11 MED ORDER — LIDOCAINE 5 % EX PTCH
1.0000 | MEDICATED_PATCH | Freq: Once | CUTANEOUS | Status: DC
  Administered 2022-09-11: 1 via TRANSDERMAL
  Filled 2022-09-11: qty 1

## 2022-09-11 NOTE — Discharge Instructions (Addendum)
It was a pleasure taking care of you today!   Your CT scans didn't show concerning emergent findings tonight. Attached is information for the on-call neurologist, call and set up a follow up appointment regarding todays ED visit. You are prescribed Robaxin and lidoderm patches.  Take medications as prescribed.  Do not operate any heavy machinery or drive while taking Robaxin as it can make you sleepy/drowsy.  You will also be sent a prescription for lidoderm patch, remove and replace with a new patch every 12 hours. You may apply heat to the affected area for up to 15 minutes at a time.  Ensure to place a barrier between your skin and the heat. Return to the Emergency Department if you are experiencing loss of bowel or bladder, increasing/worsening symptoms, fever, inability to walk.

## 2022-09-11 NOTE — ED Triage Notes (Addendum)
BIBA from home- pt was moving furniture yesterday, felt a "pop", now has upper right back and right arm pain 174/90 BP 92 HR 99% room air

## 2022-09-11 NOTE — ED Provider Notes (Signed)
[name] Provider Note   CSN: [id number] Arrival date & time: 09/11/22  1737     History  Chief Complaint  Patient presents with   Back Pain    Mikayla Hansen is a 42 y.o. female who presents emergency department with concerns for right upper back pain onset yesterday.  Notes that she was moving furniture yesterday when she felt a pop to the right upper back.  Denies pain immediately however notes that her pain started today.  Tried taking ibuprofen at home without relief of her symptoms.  Denies chest pain, shortness of breath, numbness, tingling.  The history is provided by the patient. No language interpreter was used.       Home Medications Prior to Admission medications   Medication Sig Start Date End Date Taking? Authorizing Provider  lidocaine (LIDODERM) 5 % Place 1 patch onto the skin daily. Remove & Discard patch within 12 hours or as directed by MD 09/11/22  Yes Latecia Miler A, PA-C  methocarbamol (ROBAXIN) 500 MG tablet Take 1 tablet (500 mg total) by mouth 2 (two) times daily. 09/11/22  Yes Chasmine Lender A, PA-C  albuterol (VENTOLIN HFA) 108 (90 Base) MCG/ACT inhaler Inhale 2 puffs into the lungs every 6 (six) hours as needed for wheezing.  06/21/14   [provider]  amLODipine (NORVASC) 5 MG tablet Take 2 tablets (10 mg total) by mouth daily. 03/10/19   Loura Halt A, NP  chlorthalidone (HYGROTON) 25 MG tablet Take 25 mg by mouth daily.    [provider]  FERROUS SULFATE PO Take 325 mg by mouth. Taking one PO every other day    [provider]  HYDROCHLOROTHIAZIDE PO Take 5 mg by mouth daily.    [provider]  meclizine (ANTIVERT) 25 MG tablet Take 1 tablet (25 mg total) by mouth 3 (three) times daily as needed for dizziness. 12/10/19   Vanessa Kick, MD      Allergies    Shellfish allergy, Bee venom, and Hydrocodone    Review of Systems   Review of Systems  Musculoskeletal:   Positive for back pain.  All other systems reviewed and are negative.   Physical Exam Updated Vital Signs BP (!) 189/102 (BP Location: Left Arm)   Pulse 73   Resp 20   SpO2 100%  Physical Exam Vitals and nursing note reviewed.  Constitutional:      General: She is not in acute distress.    Appearance: Normal appearance.  Eyes:     General: No scleral icterus.    Extraocular Movements: Extraocular movements intact.  Cardiovascular:     Rate and Rhythm: Normal rate and regular rhythm.     Pulses: Normal pulses.     Heart sounds: Normal heart sounds.  Pulmonary:     Effort: Pulmonary effort is normal. No respiratory distress.     Breath sounds: Normal breath sounds.  Abdominal:     Palpations: Abdomen is soft. There is no mass.     Tenderness: There is no abdominal tenderness.  Musculoskeletal:        General: Normal range of motion.     Cervical back: Neck supple.     Comments: No spinal tenderness to palpation.  Tenderness to palpation noted to right trapezius muscle spasm noted.  Full range of motion of right shoulder.  Grip strength 5/5 bilaterally.  Skin:    General: Skin is warm and dry.     Findings: No  rash.  Neurological:     Mental Status: She is alert.     Sensory: Sensation is intact.     Motor: Motor function is intact.  Psychiatric:        Behavior: Behavior normal.     ED Results / Procedures / Treatments   Labs (all labs ordered are listed, but only abnormal results are displayed) Labs Reviewed - No data to display  EKG None  Radiology CT Cervical Spine Wo Contrast  Result Date: 09/11/2022 CLINICAL DATA:  Trauma EXAM: CT CERVICAL SPINE WITHOUT CONTRAST CT THORACIC SPINE WITHOUT CONTRAST TECHNIQUE: Multidetector CT imaging of the cervical and thoracic spine was performed without contrast. Multiplanar CT image reconstructions were also generated. RADIATION DOSE REDUCTION: This exam was performed according to the departmental dose-optimization program  which includes automated exposure control, adjustment of the mA and/or kV according to patient size and/or use of iterative reconstruction technique. COMPARISON:  None Available. FINDINGS: CT CERVICAL SPINE FINDINGS Alignment: Normal. Skull base and vertebrae: No acute fracture. No primary bone lesion or focal pathologic process. Soft tissues and spinal canal: No prevertebral fluid or swelling. No visible canal hematoma. Disc levels:  Negative Upper chest: Negative. Other: None. CT THORACIC SPINE FINDINGS Alignment: Normal. Vertebrae: No acute fracture or focal pathologic process. Paraspinal and other soft tissues: Negative. Disc levels: Negative IMPRESSION: No acute fracture or subluxation of the cervical or thoracic spine. Electronically Signed   By: Ulyses Jarred M.D.   On: 09/11/2022 19:46   CT Thoracic Spine Wo Contrast  Result Date: 09/11/2022 CLINICAL DATA:  Trauma EXAM: CT CERVICAL SPINE WITHOUT CONTRAST CT THORACIC SPINE WITHOUT CONTRAST TECHNIQUE: Multidetector CT imaging of the cervical and thoracic spine was performed without contrast. Multiplanar CT image reconstructions were also generated. RADIATION DOSE REDUCTION: This exam was performed according to the departmental dose-optimization program which includes automated exposure control, adjustment of the mA and/or kV according to patient size and/or use of iterative reconstruction technique. COMPARISON:  None Available. FINDINGS: CT CERVICAL SPINE FINDINGS Alignment: Normal. Skull base and vertebrae: No acute fracture. No primary bone lesion or focal pathologic process. Soft tissues and spinal canal: No prevertebral fluid or swelling. No visible canal hematoma. Disc levels:  Negative Upper chest: Negative. Other: None. CT THORACIC SPINE FINDINGS Alignment: Normal. Vertebrae: No acute fracture or focal pathologic process. Paraspinal and other soft tissues: Negative. Disc levels: Negative IMPRESSION: No acute fracture or subluxation of the cervical  or thoracic spine. Electronically Signed   By: Ulyses Jarred M.D.   On: 09/11/2022 19:46    Procedures Procedures    Medications Ordered in ED Medications  lidocaine (LIDODERM) 5 % 1 patch (1 patch Transdermal Patch Applied 09/11/22 1859)  morphine (PF) 2 MG/ML injection 2 mg (2 mg Intramuscular Given 09/11/22 1900)    ED Course/ Medical Decision Making/ A&P Clinical Course as of 09/11/22 2113  Tue Sep 11, 2022  1946 Re-evaluated and patient resting comfortably on stretcher. Pt noted some improvement of her symptoms.  [SB]  2042 Discussed with patient negative results. Answered all available questions. Pt appears safe for discharge.  [SB]    Clinical Course User Index [SB] Mayari Matus A, PA-C                             Medical Decision Making Amount and/or Complexity of Data Reviewed Radiology: ordered.  Risk Prescription drug management.   Patient with right upper back pain x yesterday after moving  furniture.  Patient afebrile.  On exam patient with No spinal tenderness to palpation.  Tenderness to palpation noted to right trapezius muscle spasm noted.  Full range of motion of right shoulder.  Grip strength 5/5 bilaterally.Differential diagnosis includes muscle spasm/strain, fracture, dislocation, herniation.  Imaging: I ordered imaging studies including CT cervical, thoracic spine I independently visualized and interpreted imaging which showed: No acute findings I agree with the radiologist interpretation  Medications:  I ordered medication including morphine, Lidoderm patch, warm compress for pain management Reevaluation of the patient after these medicines and interventions, I reevaluated the patient and found that they have improved I have reviewed the patients home medicines and have made adjustments as needed   Disposition: Presenting suspicious for muscle strain.  Doubt concerns at this time for fracture, dislocation, herniation. After consideration of the  diagnostic results and the patients response to treatment, I feel that the patient would benefit from Discharge home.  Ambulatory referral to neurology sent to patient.  Patient sent with a prescription for Robaxin and Lidoderm.  Discussed with patient importance of no driving operate heavy machinery while taking the medication.  Discussed importance of follow-up with primary care provider for management.  Supportive care measures and strict return precautions discussed with patient at bedside. Pt acknowledges and verbalizes understanding. Pt appears safe for discharge. Follow up as indicated in discharge paperwork.    This chart was dictated using voice recognition software, Dragon. Despite the best efforts of this provider to proofread and correct errors, errors may still occur which can change documentation meaning.   Final Clinical Impression(s) / ED Diagnoses Final diagnoses:  Muscle strain    Rx / DC Orders ED Discharge Orders          Ordered    methocarbamol (ROBAXIN) 500 MG tablet  2 times daily        09/11/22 2047    lidocaine (LIDODERM) 5 %  Every 24 hours        09/11/22 2047    Ambulatory referral to Neurology       Comments: An appointment is requested in approximately: 2 weeks   09/11/22 2050              Anjuli Gemmill A, PA-C 09/11/22 2113    Audley Hose, MD 09/12/22 [id number]

## 2022-10-09 IMAGING — CT CT MAXILLOFACIAL W/ CM
3 of 4 series · 16 of 47 positions shown, 19 images · IV contrast (agent unspecified)
Comparison: None.

CLINICAL DATA: Maxillary/facial abscess edema of the upper lip,
patient feels started from inside the right nare

EXAM:
CT MAXILLOFACIAL WITH CONTRAST
TECHNIQUE: Multidetector CT imaging of the maxillofacial structures was
performed with intravenous contrast. Multiplanar CT image
reconstructions were also generated.

[Series 3: facialbone 2.0 st · axial · 0.35mm/px · z∈[-175,-29]mm · 10 of 85 slices shown, 13 images]
[im 6/85  brain]
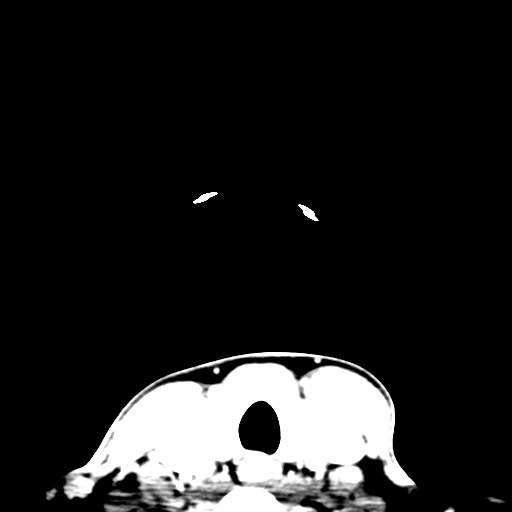
[im 6/85  bone]
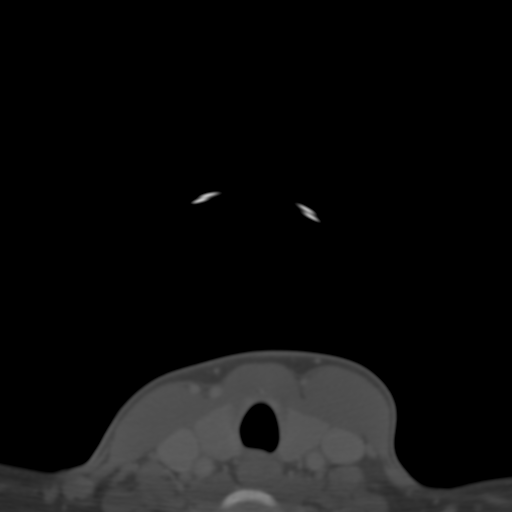
[im 15/85  bone]
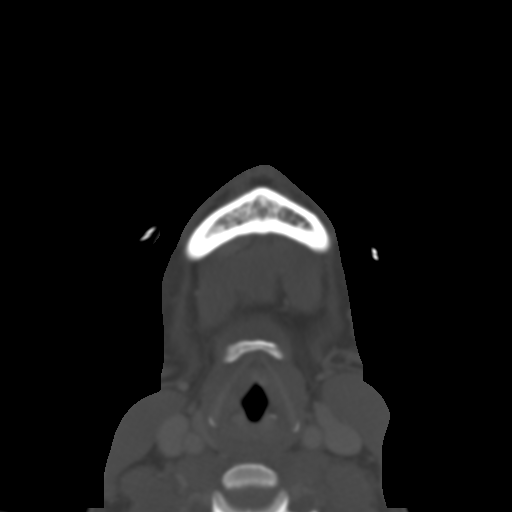
[im 24/85  bone]
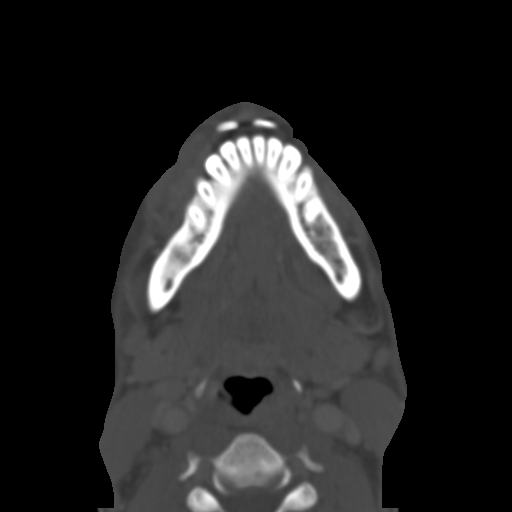
[im 29/85  bone]
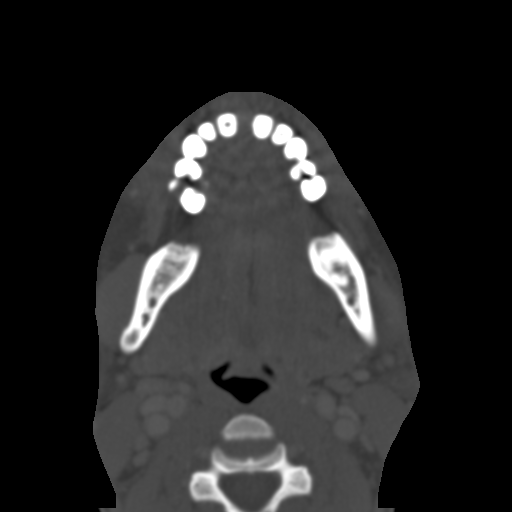
[im 38/85  brain]
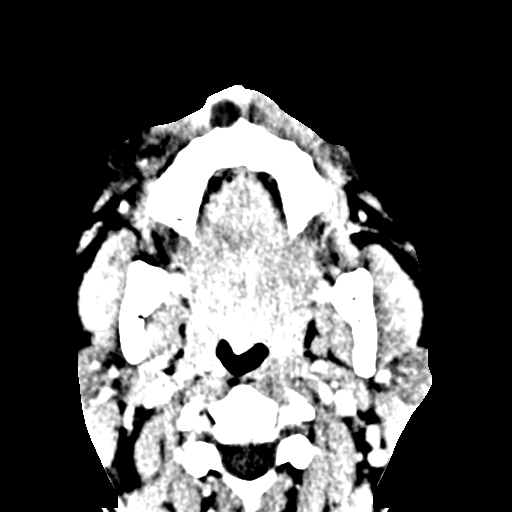
[im 38/85  bone]
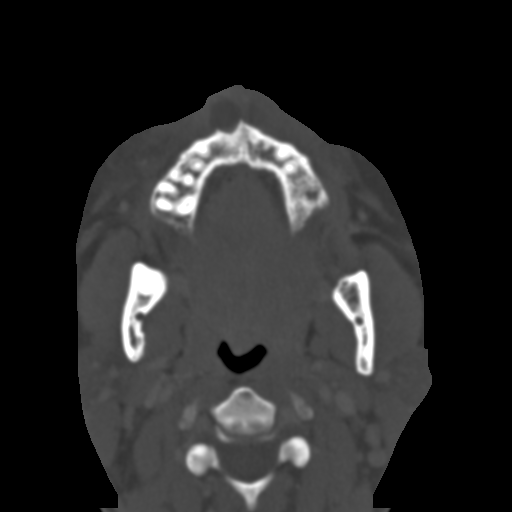
[im 47/85  bone]
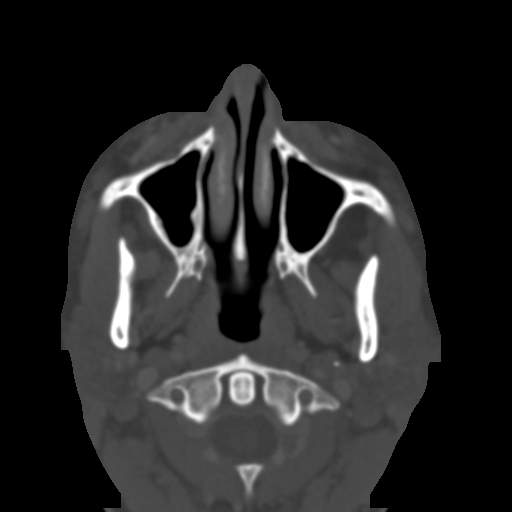
[im 56/85  bone]
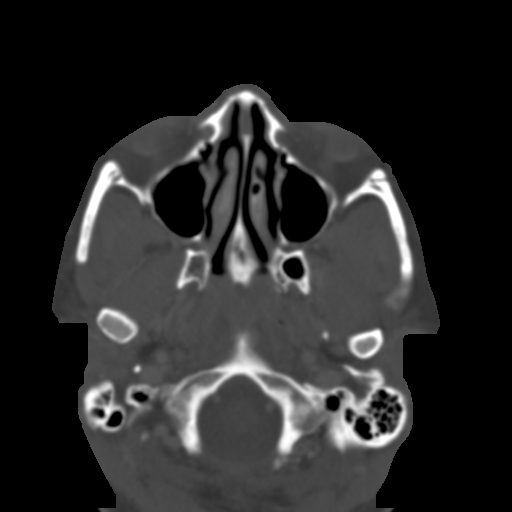
[im 64/85  bone]
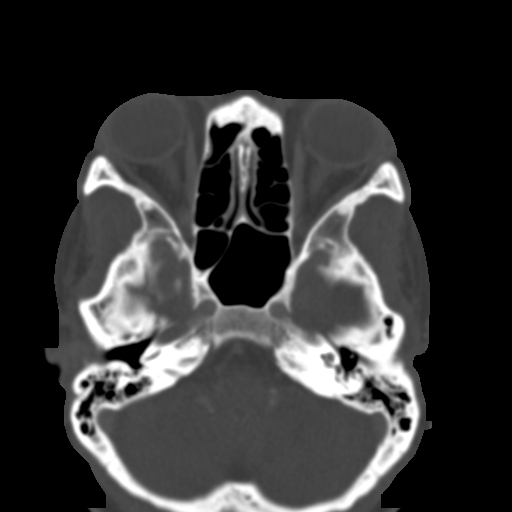
[im 70/85  brain]
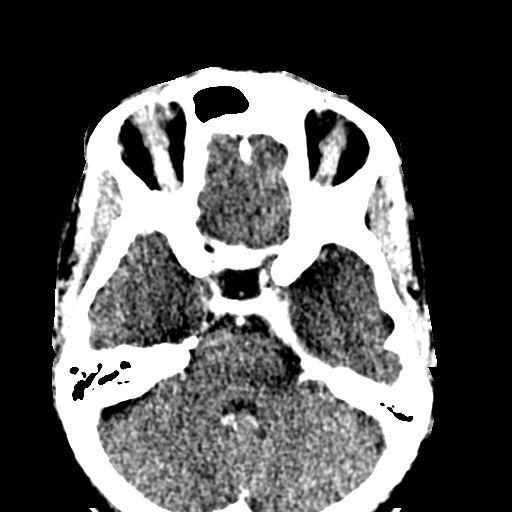
[im 70/85  bone]
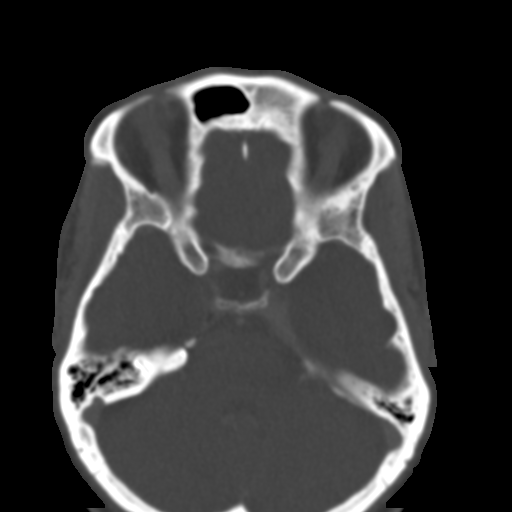
[im 79/85  bone]
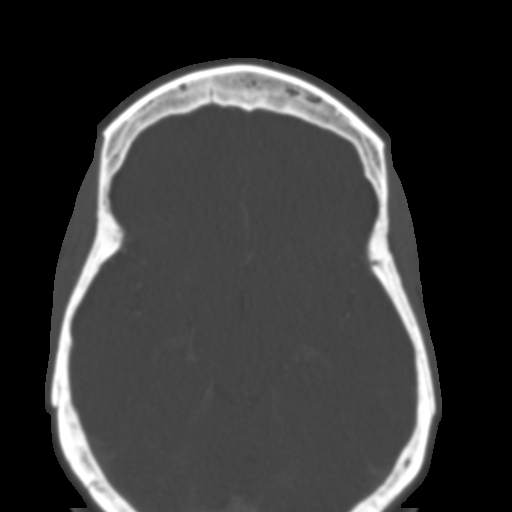

[Series 5: bone 2.0 cor · coronal · 0.33mm/px · 3 of 77 slices shown]
[im 20/77  bone]
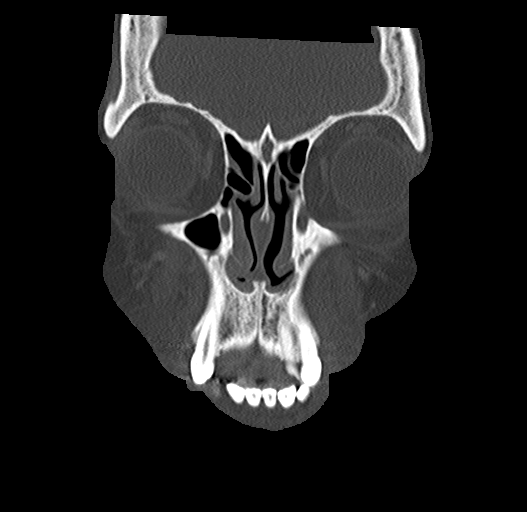
[im 39/77  bone]
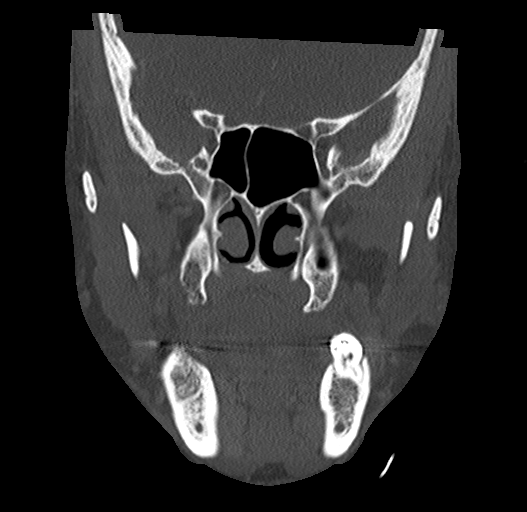
[im 58/77  bone]
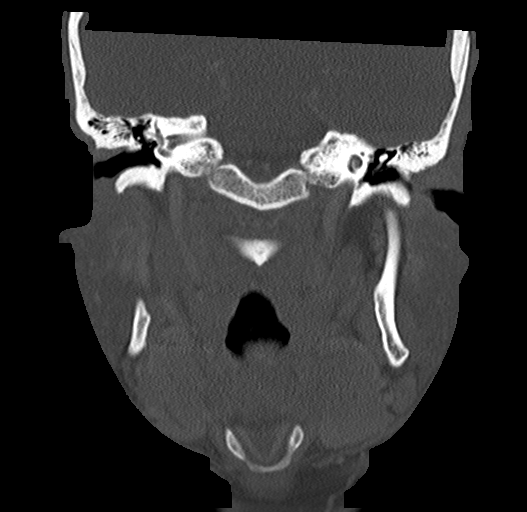

[Series 8: facialbone 2.0 sag st · sagittal · 0.30mm/px · 3 of 75 slices shown]
[im 25/75  bone]
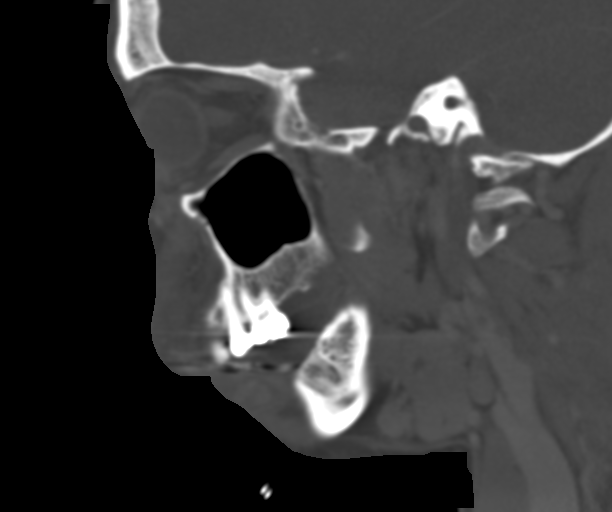
[im 38/75  bone]
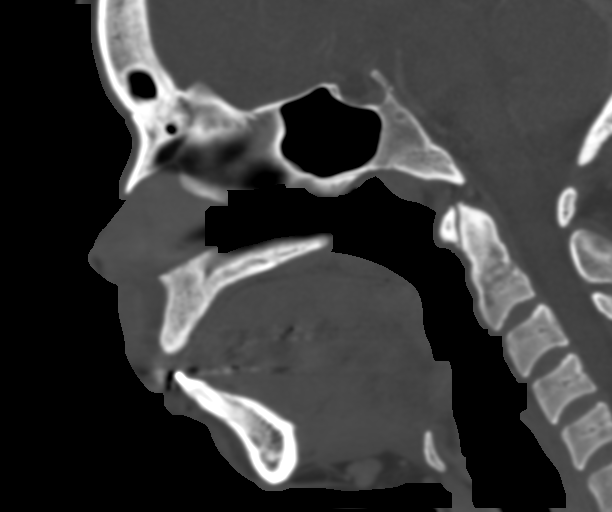
[im 50/75  bone]
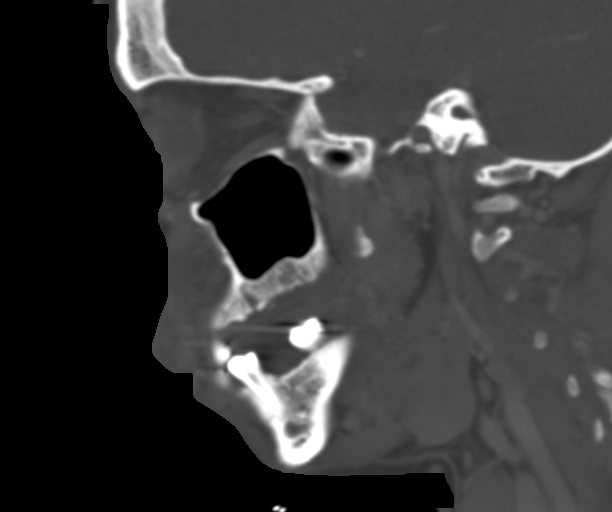

[16 of 47 positions shown; findings below may reference images not displayed]

RADIATION DOSE REDUCTION: This exam was performed according to the
departmental dose-optimization program which includes automated
exposure control, adjustment of the mA and/or kV according to
patient size and/or use of iterative reconstruction technique.

CONTRAST:  100mL OMNIPAQUE IOHEXOL 300 MG/ML  SOLN
FINDINGS: Osseous: Periapical lucency about the medial right maxillary incisor
with disruption of the buccal cortex.

Orbits: Unremarkable.

Sinuses: No significant opacification.

Soft tissues: Soft tissue swelling along the upper lip extending to
the maxillary alveolus. There is a 1.1 x 1.2 x 1.1 cm low-density
lesion contiguous with the disrupted maxillary alveolus described
above.

Limited intracranial: No abnormal enhancement.
IMPRESSION: Periapical lucency about medial right maxillary incisor with
disruption of buccal cortex and adjacent 1 cm abscess. Surrounding
cellulitis.

## 2024-01-27 ENCOUNTER — Ambulatory Visit: Payer: Self-pay | Admitting: Obstetrics

## 2024-02-14 ENCOUNTER — Encounter: Payer: Self-pay | Admitting: Obstetrics and Gynecology

## 2024-02-14 ENCOUNTER — Other Ambulatory Visit (HOSPITAL_COMMUNITY)
Admission: RE | Admit: 2024-02-14 | Discharge: 2024-02-14 | Disposition: A | Discharge status: Home / Self Care | Payer: Self-pay | Source: Ambulatory Visit | Attending: Obstetrics and Gynecology | Admitting: Obstetrics and Gynecology

## 2024-02-14 ENCOUNTER — Ambulatory Visit (INDEPENDENT_AMBULATORY_CARE_PROVIDER_SITE_OTHER): Payer: Self-pay | Admitting: Obstetrics and Gynecology

## 2024-02-14 VITALS — BP 160/101 | HR 83 | Ht 64.0 in | Wt 160.2 lb

## 2024-02-14 DIAGNOSIS — Z01419 Encounter for gynecological examination (general) (routine) without abnormal findings: Secondary | ICD-10-CM

## 2024-02-14 MED ORDER — AMLODIPINE BESYLATE 5 MG PO TABS: 10.0000 mg | ORAL_TABLET | Freq: Every day | ORAL | 1 refills | Status: AC

## 2024-02-14 NOTE — Progress Notes (Signed)
 Pt presents for annual exam. Declines to complete PHQ-9/GAD-7. Requests all STI testing. Elevate BP; has reached out to PCP for refill.

## 2024-02-14 NOTE — Progress Notes (Signed)
 Subjective:     Mikayla Hansen is a 43 y.o. female P4 with LMP 01/27/2024 and BMI 27 who is here for a comprehensive physical exam. The patient reports recent infidelity from her husband and desires STI testing. She denies pelvic pain or abnormal discharge. She reports the presence of sores or boils in her vulva. Patient is due for cervical and breast cancer screening. Patient reports a monthly period lasting 6-7 days. She is sexually active using BTL for contraception. She reports pain related to her fibroids but is not interested in a hysterectomy at this time. Patient recently lost her son and her insurance. She is still in the process of grieving and declined to see a therapist.  Past Medical History:  Diagnosis Date   Abnormal Pap smear    Asthma    Bacterial vaginosis 09-07-2011   Glaucoma    Gonorrhea 2007   Hypertension    Infection    Past Surgical History:  Procedure Laterality Date   TUBAL LIGATION  2004   WISDOM TOOTH EXTRACTION  2009   Family History  Problem Relation Age of Onset   Hypertension Mother    Seizures Mother    Cancer Paternal Grandfather    Anesthesia problems Neg Hx     Social History   Socioeconomic History   Marital status: Single    Spouse name: Not on file   Number of children: Not on file   Years of education: Not on file   Highest education level: Not on file  Occupational History   Not on file  Tobacco Use   Smoking status: Every Day    Current packs/day: 1.00    Average packs/day: 1 pack/day for 20.0 years (20.0 ttl pk-yrs)    Types: Cigarettes   Smokeless tobacco: Never   Tobacco comments:    Smoking 1 pack a day  Vaping Use   Vaping status: Never Used  Substance and Sexual Activity   Alcohol use: Yes    Alcohol/week: 0.0 standard drinks of alcohol    Comment: occ   Drug use: No   Sexual activity: Yes    Partners: Male    Birth control/protection: Surgical  Other Topics Concern   Not on file  Social History Narrative    Lives with 4 children and husband (married for 17 years). 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   Social Drivers of Corporate investment banker Strain: Not on file  Food Insecurity: Not on file  Transportation Needs: Not on file  Physical Activity: Not on file  Stress: Not on file  Social Connections: Not on file  Intimate Partner Violence: Not on file   Health Maintenance  Topic Date Due   Pneumococcal Vaccine (1 of 2 - PCV) Never done   Hepatitis B Vaccines 19-59 Average Risk (1 of 3 - 19+ 3-dose series) Never done   HPV VACCINES (1 - 3-dose SCDM series) Never done   DTaP/Tdap/Td (2 - Td or Tdap) 09/06/2021   COVID-19 Vaccine (1 - 2024-25 season) Never done   INFLUENZA VACCINE  01/17/2024   Cervical Cancer Screening (HPV/Pap Cotest)  04/03/2026   Hepatitis C Screening  Completed   HIV Screening  Completed   Meningococcal B Vaccine  Aged Out       Review of Systems Pertinent items noted in HPI and remainder of comprehensive ROS otherwise negative.   Objective:  Blood pressure (!) 160/101, pulse 83, height 5' 4 (1.626 m), weight 160 lb 3.2 oz (  72.7 kg), last menstrual period 01/27/2024.   GENERAL: Well-developed, well-nourished female in no acute distress.  HEENT: Normocephalic, atraumatic. Sclerae anicteric.  NECK: Supple. Normal thyroid.  LUNGS: Clear to auscultation bilaterally.  HEART: Regular rate and rhythm. BREASTS: Symmetric in size. No palpable masses or lymphadenopathy, skin changes, or nipple drainage. ABDOMEN: Soft, nontender, nondistended. No organomegaly. PELVIC: Normal external female genitalia. Vagina is pink and rugated.  Normal discharge. Normal appearing cervix. Uterus is normal in size. No adnexal mass or tenderness. Chaperone present during the pelvic exam EXTREMITIES: No cyanosis, clubbing, or edema, 2+ distal pulses.     Assessment:    Healthy female exam.      Plan:    Pap smear and mammogram deferred to [facility]- information provided to  the patient STI screening collected Refill on amlodipine  provided Patient will be contacted with abnormal results Patient encouraged to follow up with PCP Patient will return to address her fibroid uterus when ready See After Visit Summary for Counseling Recommendations

## 2024-02-15 LAB — HEPATITIS B SURFACE ANTIGEN: Hepatitis B Surface Ag: NEGATIVE

## 2024-02-15 LAB — HIV ANTIBODY (ROUTINE TESTING W REFLEX): HIV Screen 4th Generation wRfx: NONREACTIVE

## 2024-02-15 LAB — RPR: RPR Ser Ql: NONREACTIVE

## 2024-02-15 LAB — HEPATITIS C ANTIBODY: Hep C Virus Ab: NONREACTIVE

## 2024-02-18 ENCOUNTER — Telehealth: Payer: Self-pay

## 2024-02-18 LAB — CERVICOVAGINAL ANCILLARY ONLY
Chlamydia: NEGATIVE
Comment: NEGATIVE
Comment: NEGATIVE
Comment: NORMAL
Neisseria Gonorrhea: NEGATIVE
Trichomonas: NEGATIVE

## 2024-02-18 NOTE — Telephone Encounter (Signed)
 Telephoned patient at mobile number. Left a voice message with [facility] scheduling contact information.
